# Patient Record
Sex: Male | Born: 1975 | Race: Black or African American | Hispanic: No | Marital: Single | Smoking: Light tobacco smoker
Health system: Southern US, Community
[De-identification: ages and names within clinical notes are randomized; demographics above are authoritative.]

## PROBLEM LIST (undated history)

## (undated) DIAGNOSIS — I10 Essential (primary) hypertension: Secondary | ICD-10-CM

## (undated) HISTORY — DX: Essential (primary) hypertension: I10

---

## 2008-05-29 ENCOUNTER — Ambulatory Visit: Payer: Self-pay | Admitting: Thoracic Surgery

## 2008-05-29 ENCOUNTER — Inpatient Hospital Stay (HOSPITAL_COMMUNITY): Admission: EM | Admit: 2008-05-29 | Discharge: 2008-06-05 | Payer: Self-pay | Admitting: Emergency Medicine

## 2008-05-29 ENCOUNTER — Ambulatory Visit: Payer: Self-pay | Admitting: Pulmonary Disease

## 2008-06-13 ENCOUNTER — Encounter: Admission: RE | Admit: 2008-06-13 | Discharge: 2008-06-13 | Payer: Self-pay | Admitting: Thoracic Surgery

## 2008-06-13 ENCOUNTER — Ambulatory Visit: Payer: Self-pay | Admitting: Thoracic Surgery

## 2008-07-05 ENCOUNTER — Encounter: Admission: RE | Admit: 2008-07-05 | Discharge: 2008-07-05 | Payer: Self-pay | Admitting: Thoracic Surgery

## 2008-07-05 ENCOUNTER — Ambulatory Visit: Payer: Self-pay | Admitting: Thoracic Surgery

## 2008-11-08 ENCOUNTER — Emergency Department (HOSPITAL_COMMUNITY): Admission: EM | Admit: 2008-11-08 | Discharge: 2008-11-08 | Payer: Self-pay | Admitting: Emergency Medicine

## 2010-01-20 IMAGING — CR DG CHEST 1V PORT
1 series · 1 of 1 positions shown · non-contrast
Comparison: 06/03/2008

CLINICAL DATA: Spontaneous right pneumothorax

PORTABLE CHEST - 1 VIEW

[view not recorded]
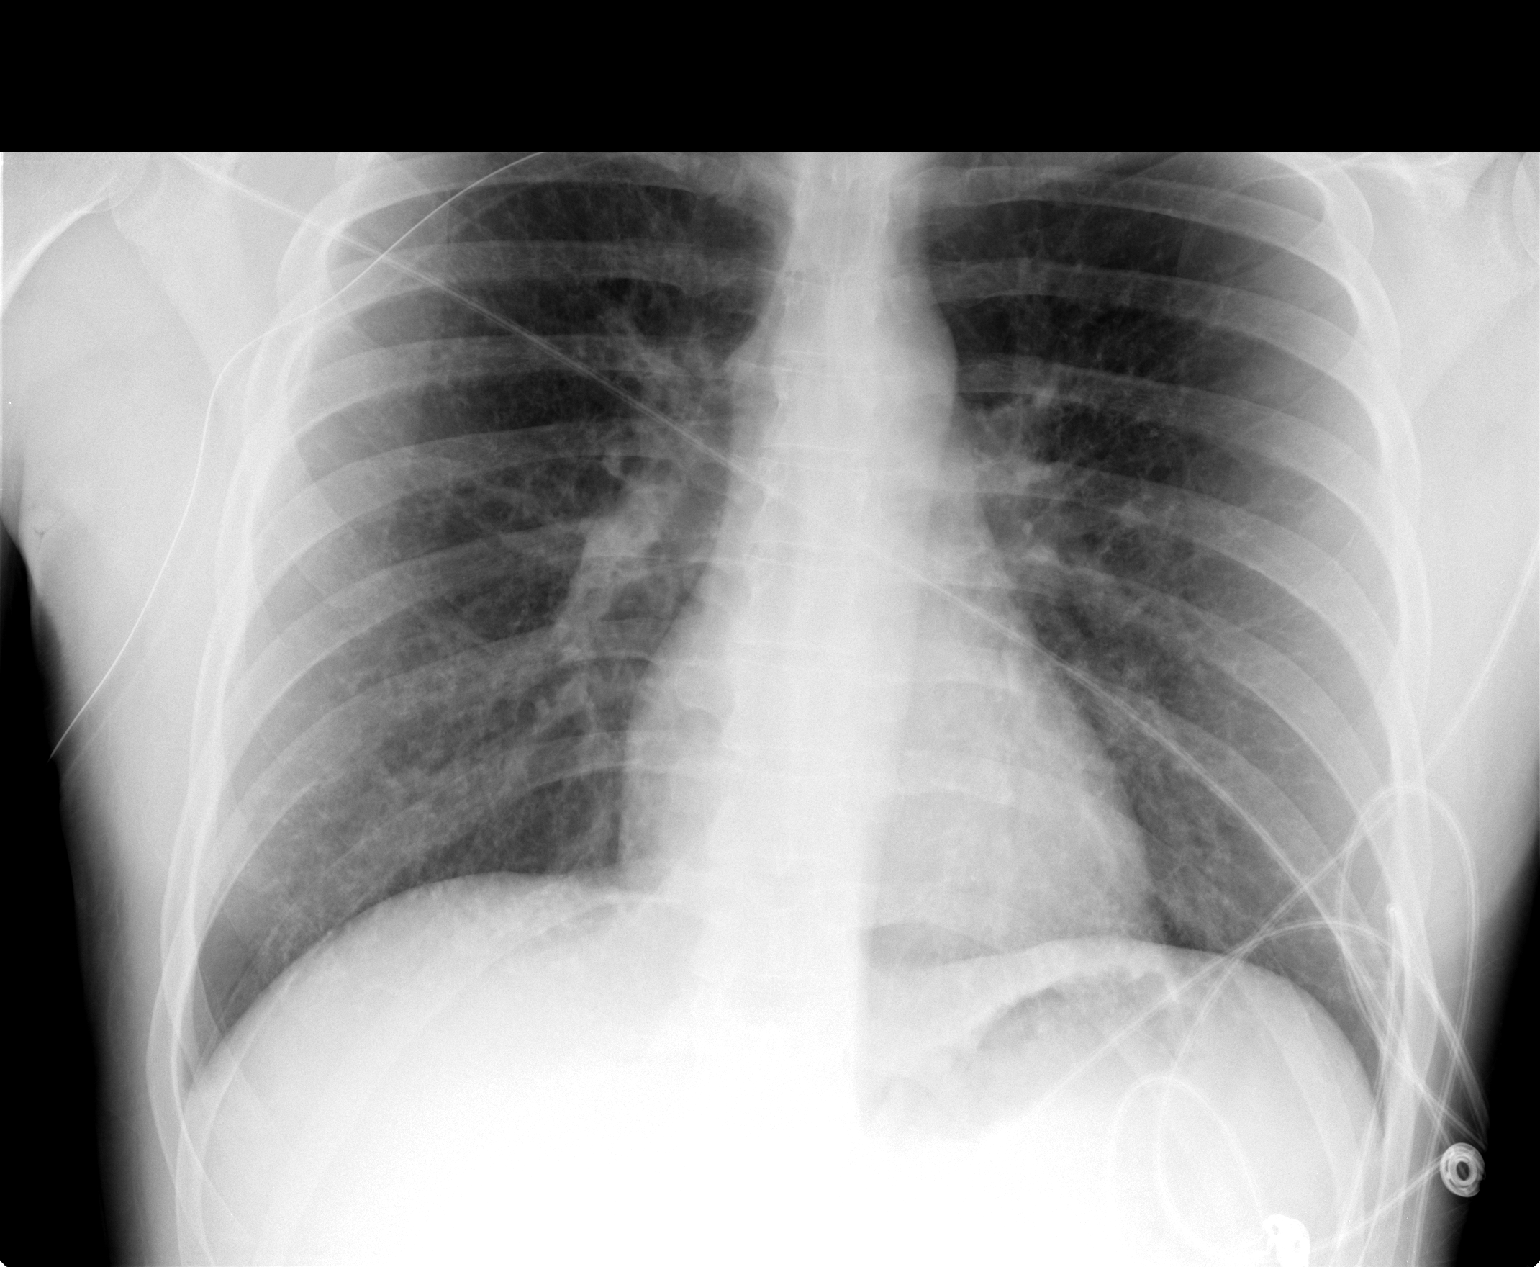

[1 of 1 positions shown; findings below may reference images not displayed]

FINDINGS: Right chest tube remains in place.  A tiny less than 5%
right apical pneumothorax is seen which it is more readily
visualized on prior exam.  The lungs are otherwise clear.  Heart
size is normal.
IMPRESSION: Tiny less 5% right pneumothorax.  Right chest tube remains in
place.

## 2010-01-29 IMAGING — CR DG CHEST 2V
2 series · 2 of 2 positions shown · non-contrast
Comparison: 06/05/2008

CLINICAL DATA: Status post a spontaneous pneumothorax.

CHEST - 2 VIEW

[w chest pa]
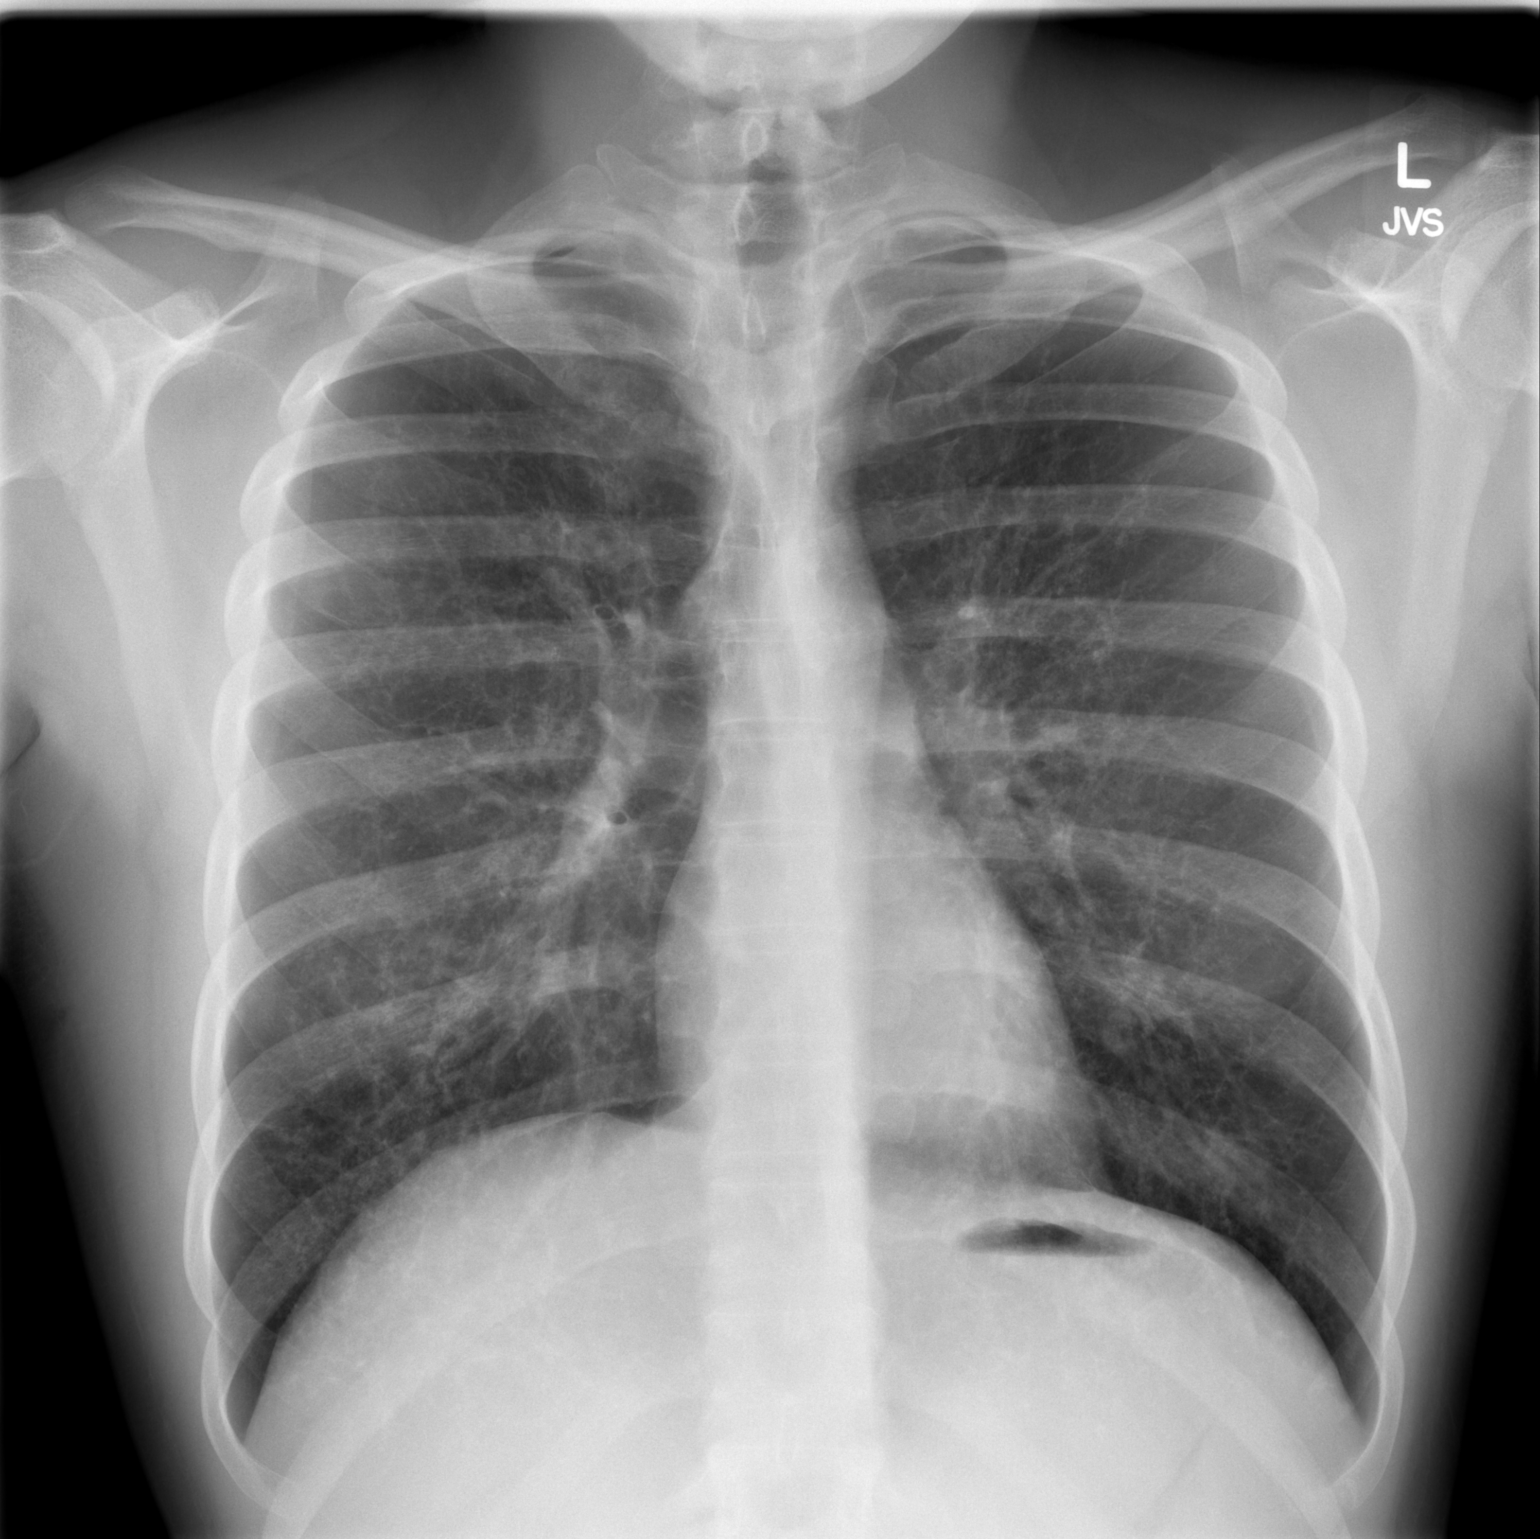

[w chest lat]
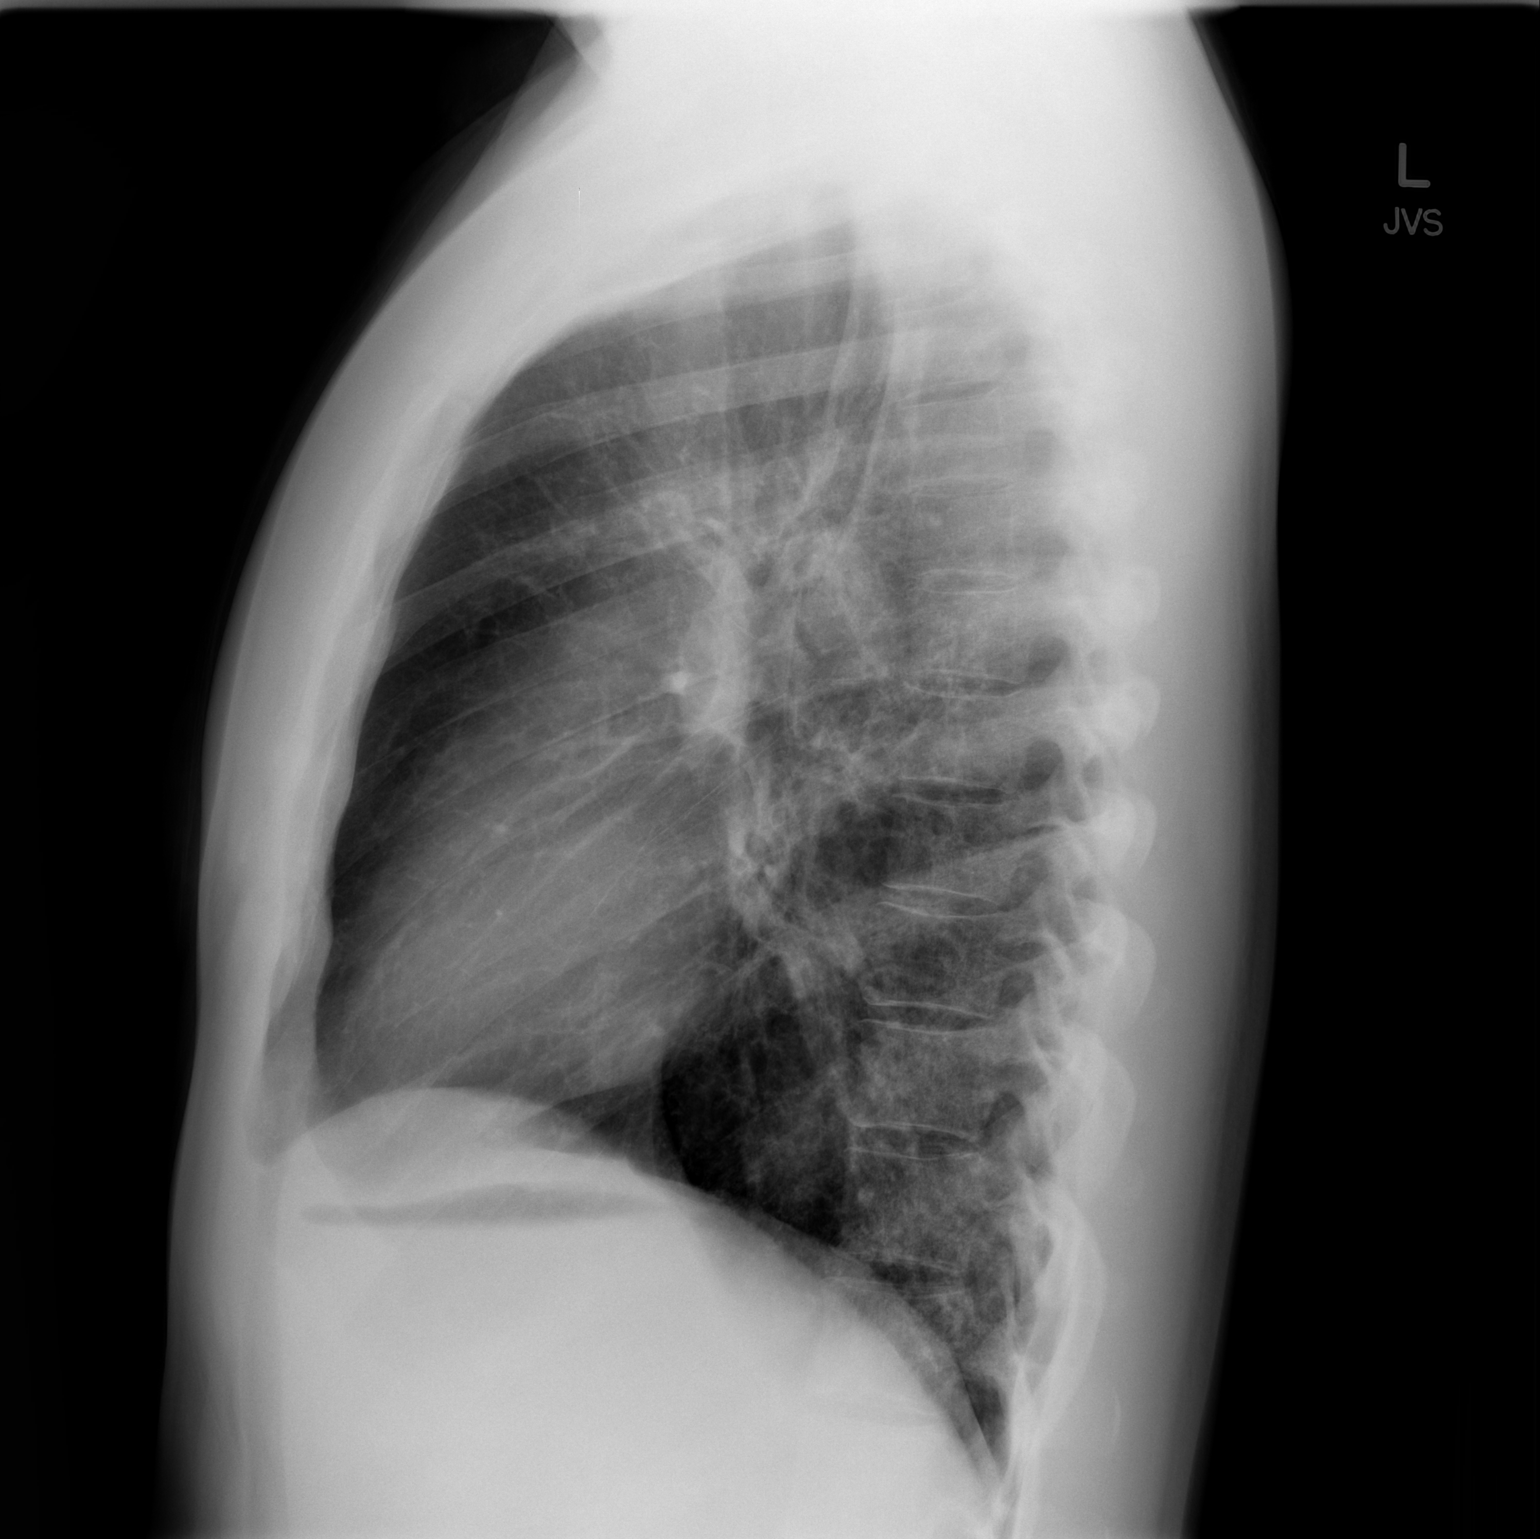

[2 of 2 positions shown; findings below may reference images not displayed]

FINDINGS: The pneumothorax has resolved.  Interstitial lung disease
is stable.  The heart is normal in size.  No effusion.
IMPRESSION: Resolved right pneumothorax.  Otherwise stable.

## 2010-12-28 ENCOUNTER — Encounter: Payer: Self-pay | Admitting: Family Medicine

## 2010-12-29 ENCOUNTER — Encounter: Payer: Self-pay | Admitting: Thoracic Surgery

## 2011-04-21 NOTE — Discharge Summary (Signed)
[name], [name]                 ACCOUNT NO.:  [id number]   MEDICAL RECORD NO.:  [id number]          PATIENT TYPE:  INP   LOCATION:  2009                         FACILITY:  [facility]   PHYSICIAN:  Ines Bloomer, M.D. DATE OF BIRTH:  01-18-1976   DATE OF ADMISSION:  05/29/2008  DATE OF DISCHARGE:  06/05/2008                               DISCHARGE SUMMARY   ADMITTING DIAGNOSIS:  Right spontaneous tension pneumothorax.   DISCHARGE DIAGNOSIS:  Right spontaneous tension pneumothorax.   PROCEDURE:  Placement of a #20 trocar chest tube by Dr. Edwyna Shell on May 29, 2008.   HISTORY PRESENTING ILLNESS:  This is a 35 year old African American male  previously healthy who after taking his shower the morning of admission  experienced some right upper chest discomfort.  This seemed to worsen  over the next hour and the discomfort moved down the anterior right side  of the chest toward the diaphragm, and then to his right lower back.  He  originally presented to an urgent care center where chest x-ray was  done.  He was found to have a right pneumothorax.  He then presented to  [facility] Emergency Room where a chest x-ray again revealed a large  right pneumothorax with slight mediastinal shift to the left.  As  previously stated, Dr. Edwyna Shell placed a right chest tube.  This was  placed on suction.  It should be noted that the patient's creatinine was  1.3 upon admission.  He was hydrated.  His creatinine then remained  stable thereafter.  He was not found to have an air leak in his chest  tube.  He was taken off suction on May 31, 2008; however, he was noted  to have an increasing size of the right pneumothorax.  He was placed  back on suction.  He was found to have a small air leak.  A CT scan of  the chest was then done, which showed a tiny residual right  pneumothorax.  A single tiny bleb of focus of emphysema in the posterior  right upper lobe and cluster diffuse likely peribronchial  vascular micro  nodules likely an infectious process.  A pulmonary consult was obtained  and autoimmune workup was initiated.  It should be noted that the  patient was found to be negative for antinuclear antibodies as well as  Legionella.  His ACE was 45.  His serum myoglobin was 29.  His CRP was  0.5.  ESR was 10.  Serial chest x-rays were continued.  Again, he was  found to have a small stable right apical pneumothorax.  He was placed  on water seal on June 04, 2008.  Chest tube was removed on this date.  Followup chest x-ray revealed again tiny right apical pneumothorax (less  than of 5%).  Currently, the patient is without complaints.  He denies  any shortness of breath.   PHYSICAL EXAMINATION:  GENERAL:  He is afebrile.  VITAL SIGNS:  Stable. O2 sat is 98%-100% on room air.  His chest x-ray again today shows a trace apical right pneumothorax, not  significantly changed from prior.  CARDIOVASCULAR:  Regular rate and rhythm.  PULMONARY:  Clear.  No rales, wheezes, or rhonchi.  EXTREMITIES:  No edema.   Per Dr. Edwyna Shell, the patient is going to be discharged home today.   DISCHARGE INSTRUCTIONS:  Include the following, he is to remain on a  regular diet.  He may remove his right chest dressing on June 06, 2008.  He may use soap and water to the wound and let open to air.  He is to  increase his activities slowly.  He may shower.  He is not to lift or  drive for 2-3 weeks.  Followup appointment include Dr. Edwyna Shell in 1 week.  He needs to call the office for an appointment.  Prior to this  appointment, chest x-ray will be obtained.  He also needs to contact Dr.  Percival Spanish office to arrange for followup appointment.  He will need to  have a CAT scan of the chest done without contrast (in 3 months).  This  will also be arranged as an outpatient.   DISCHARGE MEDICATIONS:  The patient is not having any pain and does not  wish to have a prescription for narcotics at this time.       Doree Fudge, [place]      Ines Bloomer, M.D.  Electronically Signed    DZ/MEDQ  D:  06/05/2008  T:  06/06/2008  Job:  [id number]   cc:   Felipa Evener, MD

## 2011-04-21 NOTE — Assessment & Plan Note (Signed)
OFFICE VISIT   [name], [name]  DOB:  Oct 10, 1976                                        June 13, 2008  CHART #:  [id number]   The patient came to the office today, and we have reviewed his chest x-  ray that shows that his pneumothorax is resolved, he is breathing well.  I removed his chest tube sutures.  He is doing well overall.  I have  told him to gradually increase his activities, and he is to follow up  with the pulmonologist.   His blood pressure is 134/89, pulse 94, respirations 18, and sats were  99%.   I will see him again in 3 weeks with a chest x-ray for final check.   Ines Bloomer, M.D.  Electronically Signed   DPB/MEDQ  D:  06/13/2008  T:  06/14/2008  Job:  [id number]

## 2011-04-21 NOTE — Consult Note (Signed)
NAMEJAEQUAN, [name]                 ACCOUNT NO.:  [id number]   MEDICAL RECORD NO.:  [id number]          PATIENT TYPE:  INP   LOCATION:  2009                         FACILITY:  [facility]   PHYSICIAN:  Felipa Evener, MD  DATE OF BIRTH:  May 14, 1976   DATE OF CONSULTATION:  DATE OF DISCHARGE:                                 CONSULTATION   HISTORY:  The patient is a 35 year old male with no significant past  medical history who is in the normal state of health until approximately  5 days ago when he started noticing right-sided shoulder pain that was  radiating through to his back.  The patient came to an Urgent Care  Center with that compliant and chest x-ray was performed and the patient  was found to have spontaneous pneumothorax on the right side that was  approximately 70% of the right hemithorax with no evidence of tension.  The patient has been admitted to the hospital.  Chest tube was placed by  cardiothoracic surgery and pneumothorax slowly resolved; however, the  chest tube was performed and the patient was found to have multiple  small pulmonary nodules that are present mostly in the dependant areas  of the lungs.  Upon interviewing the patient, he is a lifelong  nonsmoker, nondrinker, or minimal drinking.  No history of any drug use.  No significant history of occupational exposure.  Works as a Metallurgist.  Denies any exposure to dust or moulds or any environmental  factor.  The only thing he reports is that he [place]  that has significant amount of bird dropping.  He also was in [place] in  2003 as a part of the stay [place] in the [employer], but had had  no respiratory diseases from that.  He has no respiratory symptoms at  all.  He was able do vigorous activity with minimal effects.  The  patient denies any cough, fever, chills, nausea, vomiting, abdominal  pain or chest pain.   PAST MEDICAL HISTORY:  Significant for fracture of the left radius as  well as to right finger approximately 14 years ago during the football  accident, however, otherwise negative.   PAST SURGICAL HISTORY:  Negative.   ALLERGIES:  No known drug allergies.   MEDICATIONS:  None.   SOCIAL HISTORY:  He occasionally smokes cigars.  He had eight cigars in  the last year and a half.  Social alcohol intake.  No remote drug abuse  as well.   FAMILY HISTORY:  Both parents are still alive.  Both have hypertension.  Mother is 80 and father is 11.   A 12-point review of systems was performed and was negative other than  mentioned above.   PHYSICAL EXAMINATION:  GENERAL:  This is a well-appearing 35 year old  African American male with no significant past medical history resting  comfortably on the exam bed in no acute distress.  VITAL SIGNS:  He has temperature of 98.6, heart rate 93, respiratory 18,  blood pressure 143/99, saturation was 99% on room air.  HEENT  EXAM:  Normocephalic and atraumatic.  Pupils equal, round, and  reactive to light.  Extraocular movements are intact. Oral and nasal  mucosa within normal limits.  NECK:  No thyromegaly, lymphadenopathy.  Normal jugular venous reflux  appreciated.  HEART:  Regular rate and rhythm.  S1 and S2.  No murmurs, rubs, or  gallops appreciated.  LUNGS:  Clear to auscultation bilaterally.  ABDOMEN:  Soft, nontender, and nondistended.  Positive bowel sounds.  EXTREMITIES:  No edema.  No tenderness appreciated.  NEUROLOGIC EXAM:  Grossly intact.  SKIN EXAM:  Shows no significant rashes.   LABORATORY STUDIES:  B-MET done today showed sodium of 138, potassium of  4.5, chloride of 98, CO2 of 32.  BUN of 10 and creatinine 1.24.   On the day of presentation, the bicarb was 27 and suddenly continued to  increase, today is 32.   Chest x-rays showed the chest tube in proper placement with no visible  pneumothorax.  The chest CT done on June 01, 2008 showed right side  chest tube in proper position, minimally  appreciated pneumothoraces.  The dependent areas of the lung showed some small scattered pulmonary  nodules suspicious for tree-in-bud appearance.   ASSESSMENT AND PLAN:  The patient is a 35 year old male with no  significant past medical history who presents for a spontaneous  pneumothorax.  PCCM is called for evaluation of scattered pulmonary  nodules that are present now while the presentation of the pneumothorax  and multiple pulmonary nodules suspicious for histiocytosis X.  The  patient is a lifelong nonsmoker, which would make this less likely.  The  presence of the nodules, however, makes for a further workup.  Differential diagnosis while includes histiocytosis X  would be less  likely given the fact that the patient is a lifelong nonsmoker.  Other  things to consider would be an typical pulmonary infection since the  patient reported that he is recovering from cold, this could be  residual bronchiolitis from that as well.  Given the fact, the patient  is African American , other things to consider would be sarcoidosis as  well as Wegener granulomatosis.  Therefore, at this point, we will check  serum, ESR, CRPA, rheumatoid factor, ANCA and ACE level as well as serum  Histoplasma antigen and Mycoplasma IgM.  We will check his urinalysis  for evidence of any blood within it as well as urine Legionella antigen  for a potential atypical infection and given the bicarb level of 33, we  will check an ABG and we will potentially do further evaluation for  metabolic alkalosis if that is presently ABG, which is high likely given  the fact that the bicarb is 32.  Plan, we will repeat chest x-ray in the  morning. The patient will most likely require a repeat chest CT in 3  months to evaluate for the disappearance of the small nodules especially  given the fact that he has had a cold recently.  We would not recommend  an open lung biopsy at this time given the patient's age.  If the   patient develops the pneumothorax for the second time and would require  a surgical pleurodesis rather than talc given his age and at that point,  we would get a lung biopsy for further evaluation.   Thank you for allowing [place] to participate in the patient's care.  We will  continue to follow with you.      Felipa Evener, MD  Electronically  Signed     WJY/MEDQ  D:  06/03/2008  T:  06/04/2008  Job:  [id number]

## 2011-04-21 NOTE — Op Note (Signed)
NAMETERRELLE, RUFFOLO                 ACCOUNT NO.:  [id number]   MEDICAL RECORD NO.:  [id number]          PATIENT TYPE:  INP   LOCATION:  1832                         FACILITY:  [facility]   PHYSICIAN:  Ines Bloomer, M.D. DATE OF BIRTH:  1976/03/19   DATE OF PROCEDURE:  DATE OF DISCHARGE:                               OPERATIVE REPORT   PREOPERATIVE DIAGNOSIS:  An 80% right pneumothorax with tension.   POSTOPERATIVE DIAGNOSIS:  An 80% right pneumothorax with tension.   OPERATION PERFORMED:  Insertion of right chest tube.   DESCRIPTION OF PROCEDURE:  After prep and draping the right chest and  giving the patient 4 mg of Versed, the area was infiltrated at the fifth  intercostal space at the midaxillary line and this was infiltrated with  Xylocaine.  After this has been done, an incision was made and 2 sutures  were placed in the 1-inch incision and dissection was carried down with  the hemostat to the intercostal space, and this was blocked first with  Xylocaine and then the intercostal space was entered and then #20 trocar  chest tube was inserted.  The trocar was removed.  The chest tube was  inserted in place and sutured and tied in place with a 0 silk and  connected to Pleur-Evac.  A dry and sterile dressing applied.  The  patient returned to recovery room in stable condition.      Ines Bloomer, M.D.  Electronically Signed     DPB/MEDQ  D:  05/29/2008  T:  05/30/2008  Job:  [id number]

## 2011-04-21 NOTE — H&P (Signed)
[name], [name]                 ACCOUNT NO.:  [id number]   MEDICAL RECORD NO.:  [id number]          PATIENT TYPE:  EMS   LOCATION:  MAJO                         FACILITY:  [facility]   PHYSICIAN:  Ines Bloomer, M.D. DATE OF BIRTH:  10-09-1976   DATE OF ADMISSION:  05/29/2008  DATE OF DISCHARGE:                              HISTORY & PHYSICAL   REASON FOR ADMISSION:  Right spontaneous tension pneumothorax.   HISTORY OF PRESENTING ILLNESS:  This is a 35 year old previously healthy  African American male who after taking his shower this morning  experienced some right upper chest discomfort.  He noticed that after  exhalation or after talking for a while, this discomfort worsened over  the next hour.  His right upper chest discomfort also moved down  anterior right side of the chest towards the diaphragm, the pain then  moved to the right lower back.  He presented to the urgent care center  where chest x-ray was done.  He was found to have a right pneumothorax.  The patient then presented to [facility] where again a  chest x-ray revealed a large right pneumothorax with slight mediastinal  shift to the left.  Dr. Edwyna Shell was consulted regarding the necessitation  for chest tube placement and further management.   PAST MEDICAL HISTORY:  Significant for fracture of the left radius from  a football injury, approximately 14 years ago.  Otherwise, negative.   PAST SURGICAL HISTORY:  Denies.   ALLERGIES:  No known drug allergies.   MEDICATIONS:  On a daily basis, denies.   SOCIAL HISTORY:  Various cigar use.  Alcohol intake, he describes as  social.   FAMILY HISTORY:  Mother and father are still alive.  Both have  hypertension.  Mother is 70.  Father is 56.   REVIEW OF SYMPTOMS:  The patient had some shortness of breath mostly  chest discomfort as described in the HPI.  Denies any fever, chills,  night sweats, cough, sputum, or hemoptysis.  Denies any abdominal pain,  nauseaousness, or vomiting.  No melena or hematochezia.  Denies any  blurred or double vision.  Denies dysuria or hematuria.  Remaining  review of symptoms are noncontributory.   PHYSICAL EXAMINATION:  GENERAL:  This is a pleasant 35 year old [place]  American male with no acute distress.  He is alert, oriented, and  cooperative.  VITAL SIGNS:  Reveal he is afebrile.  Heart rate is in the low100s, BP  136/82, respiratory rate 20-24, and O2 sat of 100% on 2 L of nasal  cannula.  HEENT:  Head is atraumatic and normocephalic.  Eyes:  Extraocular  movements are intact.  Pupils are equal, round, and reactive to light  and accommodation.  Sclerae anicteric.  NECK:  Supple.  No JVD.  No lymphadenopathy.  CARDIOVASCULAR:  Slightly tachycardic.  PULMONARY EXAM:  Left lung clear to auscultation.  No rales, wheezes, or  rhonchi.  Right lung, diminished breath sounds.  ABDOMEN:  Soft and nontender.  Bowel sounds present.  No CVA tenderness.  No rebound or guarding.  EXTREMITIES:  No cyanosis, clubbing, or edema.  NEUROLOGIC:  Cranial nerve II through XII grossly intact.  No focal  deficits.   PERTINENT IMAGING STUDY:  Chest x-ray has already been described  previously.   LABORATORY DATA:  Laboratory studies revealed his H&H to be 17.3 and 51  respectively.  Potassium of 3.5 and creatinine level of 1.3  respectively.   IMPRESSION AND PLAN:  Right spontaneous tension pneumothorax.  The  patient is going to have a chest tube placed by Dr. Edwyna Shell.  Chest tube  then will be placed to 20 cm of suction.  Stat chest x-ray will be  obtained now.  The patient will have daily chest x-rays obtained  thereafter.  We will continue monitor him closely.      Doree Fudge, [place]      Ines Bloomer, M.D.  Electronically Signed    DZ/MEDQ  D:  05/29/2008  T:  05/30/2008  Job:  [id number]

## 2011-04-21 NOTE — Assessment & Plan Note (Signed)
OFFICE VISIT   [name], [name]  DOB:  07/22/1976                                        July 05, 2008  CHART #:  [id number]   The patient came to the office with chest x-ray stable and with no  recurrence of his pneumothorax.  His blood pressure is 143/91, pulse  100, respirations 18, and sats were 99%.  Lungs were clear to  auscultation and percussion.  The chest tube site is well-healed.  I  will see him back again if he has any future problems.   Ines Bloomer, M.D.  Electronically Signed   DPB/MEDQ  D:  07/05/2008  T:  07/06/2008  Job:  [id number]

## 2011-09-03 LAB — BASIC METABOLIC PANEL
BUN: 7
BUN: 9
Chloride: 101
Chloride: 98
Creatinine, Ser: 1.25
GFR calc Af Amer: >60
GFR calc Af Amer: >60
GFR calc Af Amer: >60
GFR calc non Af Amer: >60
GFR calc non Af Amer: >60
Potassium: 4.2
Potassium: 4.2
Potassium: 4.5
Potassium: 4.6
Sodium: 140

## 2011-09-03 LAB — RENAL FUNCTION PANEL
Albumin: 4.3
CO2: 31
Chloride: 99
Creatinine, Ser: 1.25
GFR calc Af Amer: >60
GFR calc non Af Amer: >60
Potassium: 3.7

## 2011-09-03 LAB — COMPREHENSIVE METABOLIC PANEL
BUN: 9
CO2: 33 — ABNORMAL HIGH
Chloride: 102
Creatinine, Ser: 1.38
GFR calc non Af Amer: >60
Glucose, Bld: 96
Total Bilirubin: 0.7

## 2011-09-03 LAB — POCT I-STAT, CHEM 8
Chloride: 105
Glucose, Bld: 95
HCT: 51
Potassium: 3.5

## 2011-09-03 LAB — URINE CULTURE
Colony Count: 2000
Special Requests: NEGATIVE

## 2011-09-03 LAB — DIFFERENTIAL
Lymphocytes Relative: 18
Lymphs Abs: 1.3
Monocytes Relative: 6
Neutrophils Relative %: 73

## 2011-09-03 LAB — CBC
HCT: 44.9
HCT: 47.8
MCV: 82.9
Platelets: 131 — ABNORMAL LOW
Platelets: 144 — ABNORMAL LOW
RBC: 5.42
RBC: 5.77
WBC: 7.2
WBC: 8.3

## 2011-09-03 LAB — LEGIONELLA ANTIGEN, URINE

## 2011-09-03 LAB — BLOOD GAS, ARTERIAL
Bicarbonate: 26.1 — ABNORMAL HIGH
FIO2: 0.21
O2 Saturation: 98
pH, Arterial: 7.425
pO2, Arterial: 97.8

## 2011-09-03 LAB — URINALYSIS, ROUTINE W REFLEX MICROSCOPIC
Nitrite: NEGATIVE
Specific Gravity, Urine: 1.011
Urobilinogen, UA: 0.2
pH: 7.5

## 2011-09-03 LAB — ANTI-NEUTROPHIL ANTIBODY

## 2011-09-03 LAB — MYOGLOBIN, SERUM: Myoglobin: 29

## 2014-12-16 ENCOUNTER — Ambulatory Visit (INDEPENDENT_AMBULATORY_CARE_PROVIDER_SITE_OTHER): Payer: 59 | Admitting: Emergency Medicine

## 2014-12-16 ENCOUNTER — Ambulatory Visit (INDEPENDENT_AMBULATORY_CARE_PROVIDER_SITE_OTHER): Payer: 59

## 2014-12-16 VITALS — BP 202/145 | HR 96 | Temp 98.7°F | Resp 16 | Ht 71.0 in | Wt 235.6 lb

## 2014-12-16 DIAGNOSIS — Z8709 Personal history of other diseases of the respiratory system: Secondary | ICD-10-CM

## 2014-12-16 DIAGNOSIS — R030 Elevated blood-pressure reading, without diagnosis of hypertension: Secondary | ICD-10-CM

## 2014-12-16 DIAGNOSIS — I10 Essential (primary) hypertension: Secondary | ICD-10-CM

## 2014-12-16 DIAGNOSIS — R000 Tachycardia, unspecified: Secondary | ICD-10-CM

## 2014-12-16 DIAGNOSIS — IMO0001 Reserved for inherently not codable concepts without codable children: Secondary | ICD-10-CM

## 2014-12-16 DIAGNOSIS — R809 Proteinuria, unspecified: Secondary | ICD-10-CM

## 2014-12-16 LAB — POCT CBC
Granulocyte percent: 74.4 %G (ref 37–80)
HCT, POC: 52 % (ref 43.5–53.7)
HEMOGLOBIN: 17.1 g/dL (ref 14.1–18.1)
Lymph, poc: 1.6 (ref 0.6–3.4)
MCH, POC: 27.1 pg (ref 27–31.2)
MCHC: 32.8 g/dL (ref 31.8–35.4)
MCV: 82.5 fL (ref 80–97)
MID (cbc): 0.6 (ref 0–0.9)
MPV: 7.5 fL (ref 0–99.8)
POC GRANULOCYTE: 6.3 (ref 2–6.9)
POC LYMPH PERCENT: 19 %L (ref 10–50)
POC MID %: 6.6 %M (ref 0–12)
Platelet Count, POC: 151 10*3/uL (ref 142–424)
RBC: 6.3 M/uL — AB (ref 4.69–6.13)
RDW, POC: 12.8 %
WBC: 8.5 10*3/uL (ref 4.6–10.2)

## 2014-12-16 LAB — POCT URINALYSIS DIPSTICK
Bilirubin, UA: NEGATIVE
GLUCOSE UA: NEGATIVE
Leukocytes, UA: NEGATIVE
Nitrite, UA: NEGATIVE
PH UA: 6.5
Spec Grav, UA: 1.02
UROBILINOGEN UA: 0.2

## 2014-12-16 LAB — BASIC METABOLIC PANEL
BUN: 17 mg/dL (ref 4–21)
CREATININE: 1.6 mg/dL — AB (ref 0.6–1.3)
Glucose: 102 mg/dL
POTASSIUM: 4.4 mmol/L (ref 3.4–5.3)
Sodium: 139 mmol/L (ref 137–147)

## 2014-12-16 LAB — LIPID PANEL
CHOLESTEROL: 148 mg/dL (ref 0–200)
HDL: 46 mg/dL (ref 35–70)
LDL Cholesterol: 83 mg/dL
LDL/HDL RATIO: 3.2
Triglycerides: 95 mg/dL (ref 40–160)

## 2014-12-16 LAB — HEPATIC FUNCTION PANEL
ALT: 59 U/L — AB (ref 10–40)
AST: 25 U/L (ref 14–40)
Alkaline Phosphatase: 84 U/L (ref 25–125)
Bilirubin, Total: 0.7 mg/dL

## 2014-12-16 MED ORDER — LISINOPRIL-HYDROCHLOROTHIAZIDE 20-12.5 MG PO TABS: 1.0000 | ORAL_TABLET | Freq: Every day | ORAL | Status: DC

## 2014-12-16 NOTE — Patient Instructions (Addendum)
- [facility] so that we can collect a 24 hour urine sample to further evaluate protein in your urine. - Please start 2 blood pressure medications: Lisinopril 20mg  daily, Hydrochlorothiazide 12.5mg  daily - When the rest of your blood results come back, I will call you and make changes as needed to our treatment plan. - Be on the lookout for your referral to a kidney specialist. - Please come to our Appointment Center at [address] at [id number] on [id number]/2015 for follow up.   Managing Your High Blood Pressure Blood pressure is a measurement of how forceful your blood is pressing against the walls of the arteries. Arteries are muscular tubes within the circulatory system. Blood pressure does not stay the same. Blood pressure rises when you are active, excited, or nervous; and it lowers during sleep and relaxation. If the numbers measuring your blood pressure stay above normal most of the time, you are at risk for health problems. High blood pressure (hypertension) is a long-term (chronic) condition in which blood pressure is elevated. A blood pressure reading is recorded as two numbers, such as 120 over 80 (or 120/80). The first, higher number is called the systolic pressure. It is a measure of the pressure in your arteries as the heart beats. The second, lower number is called the diastolic pressure. It is a measure of the pressure in your arteries as the heart relaxes between beats.  Keeping your blood pressure in a normal range is important to your overall health and prevention of health problems, such as heart disease and stroke. When your blood pressure is uncontrolled, your heart has to work harder than normal. High blood pressure is a very common condition in adults because blood pressure tends to rise with age. Men and women are equally likely to have hypertension but at different times in life. Before age 37, men are more likely to have hypertension. After 37 years of age, women are  more likely to have it. Hypertension is especially common in African Americans. This condition often has no signs or symptoms. The cause of the condition is usually not known. Your caregiver can help you come up with a plan to keep your blood pressure in a normal, healthy range. BLOOD PRESSURE STAGES Blood pressure is classified into four stages: normal, prehypertension, stage 1, and stage 2. Your blood pressure reading will be used to determine what type of treatment, if any, is necessary. Appropriate treatment options are tied to these four stages:  Normal  Systolic pressure (mm Hg): below 120.  Diastolic pressure (mm Hg): below 80. Prehypertension  Systolic pressure (mm Hg): 120 to 139.  Diastolic pressure (mm Hg): 80 to 89. Stage1  Systolic pressure (mm Hg): 140 to 159.  Diastolic pressure (mm Hg): 90 to 99. Stage2  Systolic pressure (mm Hg): 160 or above.  Diastolic pressure (mm Hg): 100 or above. RISKS RELATED TO HIGH BLOOD PRESSURE Managing your blood pressure is an important responsibility. Uncontrolled high blood pressure can lead to:  A heart attack.  A stroke.  A weakened blood vessel (aneurysm).  Heart failure.  Kidney damage.  Eye damage.  Metabolic syndrome.  Memory and concentration problems. HOW TO MANAGE YOUR BLOOD PRESSURE Blood pressure can be managed effectively with lifestyle changes and medicines (if needed). Your caregiver will help you come up with a plan to bring your blood pressure within a normal range. Your plan should include the following: Education  Read all information provided by your caregivers about  how to control blood pressure.  Educate yourself on the latest guidelines and treatment recommendations. New research is always being done to further define the risks and treatments for high blood pressure. Lifestylechanges  Control your weight.  Avoid smoking.  Stay physically active.  Reduce the amount of salt in your  diet.  Reduce stress.  Control any chronic conditions, such as high cholesterol or diabetes.  Reduce your alcohol intake. Medicines  Several medicines (antihypertensive medicines) are available, if needed, to bring blood pressure within a normal range. Communication  Review all the medicines you take with your caregiver because there may be side effects or interactions.  Talk with your caregiver about your diet, exercise habits, and other lifestyle factors that may be contributing to high blood pressure.  See your caregiver regularly. Your caregiver can help you create and adjust your plan for managing high blood pressure. RECOMMENDATIONS FOR TREATMENT AND FOLLOW-UP  The following recommendations are based on current guidelines for managing high blood pressure in nonpregnant adults. Use these recommendations to identify the proper follow-up period or treatment option based on your blood pressure reading. You can discuss these options with your caregiver.  Systolic pressure of 120 to 139 or diastolic pressure of 80 to 89: Follow up with your caregiver as directed.  Systolic pressure of 140 to 160 or diastolic pressure of 90 to 100: Follow up with your caregiver within 2 months.  Systolic pressure above 160 or diastolic pressure above 100: Follow up with your caregiver within 1 month.  Systolic pressure above 180 or diastolic pressure above 110: Consider antihypertensive therapy; follow up with your caregiver within 1 week.  Systolic pressure above 200 or diastolic pressure above 120: Begin antihypertensive therapy; follow up with your caregiver within 1 week. Document Released: 08/17/2012 Document Reviewed: 08/17/2012 [facility] Patient Information 2015 [place], [place]. This information is not intended to replace advice given to you by your health care provider. Make sure you discuss any questions you have with your health care provider.    DASH Eating Plan DASH stands for "Dietary  Approaches to Stop Hypertension." The DASH eating plan is a healthy eating plan that has been shown to reduce high blood pressure (hypertension). Additional health benefits may include reducing the risk of type 2 diabetes mellitus, heart disease, and stroke. The DASH eating plan may also help with weight loss. WHAT DO I NEED TO KNOW ABOUT THE DASH EATING PLAN? For the DASH eating plan, you will follow these general guidelines:  Choose foods with a percent daily value for sodium of less than 5% (as listed on the food label).  Use salt-free seasonings or herbs instead of table salt or sea salt.  Check with your health care provider or pharmacist before using salt substitutes.  Eat lower-sodium products, often labeled as "lower sodium" or "no salt added."  Eat fresh foods.  Eat more vegetables, fruits, and low-fat dairy products.  Choose whole grains. Look for the word "whole" as the first word in the ingredient list.  Choose fish and skinless chicken or [place] more often than red meat. Limit fish, poultry, and meat to 6 oz (170 g) each day.  Limit sweets, desserts, sugars, and sugary drinks.  Choose heart-healthy fats.  Limit cheese to 1 oz (28 g) per day.  Eat more home-cooked food and less restaurant, buffet, and fast food.  Limit fried foods.  Cook foods using methods other than frying.  Limit canned vegetables. If you do use them, rinse them well to  decrease the sodium.  When eating at a restaurant, ask that your food be prepared with less salt, or no salt if possible. WHAT FOODS CAN I EAT? Seek help from a dietitian for individual calorie needs. Grains Whole grain or whole wheat bread. Brown rice. Whole grain or whole wheat pasta. Quinoa, bulgur, and whole grain cereals. Low-sodium cereals. Corn or whole wheat flour tortillas. Whole grain cornbread. Whole grain crackers. Low-sodium crackers. Vegetables Fresh or frozen vegetables (raw, steamed, roasted, or grilled).  Low-sodium or reduced-sodium tomato and vegetable juices. Low-sodium or reduced-sodium tomato sauce and paste. Low-sodium or reduced-sodium canned vegetables.  Fruits All fresh, canned (in natural juice), or frozen fruits. Meat and Other Protein Products Ground beef (85% or leaner), grass-fed beef, or beef trimmed of fat. Skinless chicken or [place]. Ground chicken or [place]. Pork trimmed of fat. All fish and seafood. Eggs. Dried beans, peas, or lentils. Unsalted nuts and seeds. Unsalted canned beans. Dairy Low-fat dairy products, such as skim or 1% milk, 2% or reduced-fat cheeses, low-fat ricotta or cottage cheese, or plain low-fat yogurt. Low-sodium or reduced-sodium cheeses. Fats and Oils Tub margarines without trans fats. Light or reduced-fat mayonnaise and salad dressings (reduced sodium). Avocado. Safflower, olive, or canola oils. Natural peanut or almond butter. Other Unsalted popcorn and pretzels. The items listed above may not be a complete list of recommended foods or beverages. Contact your dietitian for more options. WHAT FOODS ARE NOT RECOMMENDED? Grains White bread. White pasta. White rice. Refined cornbread. Bagels and croissants. Crackers that contain trans fat. Vegetables Creamed or fried vegetables. Vegetables in a cheese sauce. Regular canned vegetables. Regular canned tomato sauce and paste. Regular tomato and vegetable juices. Fruits Dried fruits. Canned fruit in light or heavy syrup. Fruit juice. Meat and Other Protein Products Fatty cuts of meat. Ribs, chicken wings, bacon, sausage, bologna, salami, chitterlings, fatback, hot dogs, bratwurst, and packaged luncheon meats. Salted nuts and seeds. Canned beans with salt. Dairy Whole or 2% milk, cream, half-and-half, and cream cheese. Whole-fat or sweetened yogurt. Full-fat cheeses or blue cheese. Nondairy creamers and whipped toppings. Processed cheese, cheese spreads, or cheese curds. Condiments Onion and garlic salt,  seasoned salt, table salt, and sea salt. Canned and packaged gravies. Worcestershire sauce. Tartar sauce. Barbecue sauce. Teriyaki sauce. Soy sauce, including reduced sodium. Steak sauce. Fish sauce. Oyster sauce. Cocktail sauce. Horseradish. Ketchup and mustard. Meat flavorings and tenderizers. Bouillon cubes. Hot sauce. Tabasco sauce. Marinades. Taco seasonings. Relishes. Fats and Oils Butter, stick margarine, lard, shortening, ghee, and bacon fat. Coconut, palm kernel, or palm oils. Regular salad dressings. Other Pickles and olives. Salted popcorn and pretzels. The items listed above may not be a complete list of foods and beverages to avoid. Contact your dietitian for more information. WHERE CAN I FIND MORE INFORMATION? National Heart, Lung, and Blood Institute: [employer] Document Released: 11/12/2011 Document Revised: 04/09/2014 Document Reviewed: 09/27/2013 [facility] Patient Information 2015 [place], [place]. This information is not intended to replace advice given to you by your health care provider. Make sure you discuss any questions you have with your health care provider.

## 2014-12-16 NOTE — Progress Notes (Signed)
MRN: [id number] DOB: 1976/06/03  Subjective:   [name] is a 39 y.o. male with a pmh of spontaneous pneumothorax (2009) and family history of HTN, stroke presenting for 2 week history of feeling "off". Patient states it is difficult to verbalize how he feels, however he has had a previous similar episode in 2012 except with chest pain at that time. Per the patient he was worked up fully and thought to have reflux, tx with Zantac, symptoms resolved. Today, he states he tried Zantac x2 in the last week without any significant changes. The only symptoms patient identifies are occasional random numbness and tingling in right forearm and left lateral neck; also admits recent stress at work, patient is a Occupational hygienist at [employer], has not had adequate support. He denies chest pain, heart racing, palpitations, shob, diaphoresis, radiation of pain into extremities, back, neck or jaw; also denies headache, confusion, loss of vision, double vision, changes in speech, weakness, n/v, abdominal pain, urinary or defecation changes. Of note, patient eats fast food, processed meals regularly, does not exercise. Denies polyuria, polydipsia, polyphagia, family history of diabetes. Seldomly smokes cigars, no cigarettes, occasional alcohol. Denies any other aggravating or relieving factors, no other questions or concerns.  [name] currently has no medications in their medication list.  He has No Known Allergies.  PMH as above. Denies past surgical history.  ROS As in subjective.  Objective:   Vitals: BP 202/145 mmHg  Pulse 96  Temp(Src) 98.7 F (37.1 C) (Oral)  Resp 16  Ht  (1.803 m)  Wt 235 lb 9.6 oz (106.867 kg)  BMI 32.87 kg/m2  SpO2 99%  Blood Pressure recheck on exam: 208/108 left arm 204/112 right arm  Physical Exam  Constitutional: He is oriented to person, place, and time and well-developed, well-nourished, and in no distress.  Eyes: Conjunctivae and EOM are normal. Pupils are  equal, round, and reactive to light. Right eye exhibits no discharge. Left eye exhibits no discharge. No scleral icterus.  Neck: Normal range of motion. Neck supple. No thyromegaly present.  No carotid bruits.  Cardiovascular: Regular rhythm, normal heart sounds and intact distal pulses.  Exam reveals no gallop and no friction rub.   No murmur heard. Tachycardic on exam (HR 108)  Pulmonary/Chest: Effort normal and breath sounds normal. No stridor. No respiratory distress. He has no wheezes. He has no rales. He exhibits no tenderness.  Abdominal: Soft. Bowel sounds are normal. He exhibits no distension and no mass. There is no tenderness.  No bruits.  Musculoskeletal: Normal range of motion. He exhibits no edema or tenderness.  Lymphadenopathy:    He has no cervical adenopathy.  Neurological: He is alert and oriented to person, place, and time.  Skin: Skin is warm and dry. No rash noted. No erythema.  Psychiatric: Mood and affect normal.   Results for orders placed or performed in visit on 12/16/14 (from the past 24 hour(s))  POCT CBC     Status: Abnormal   Collection Time: 12/16/14  3:26 PM  Result Value Ref Range   WBC 8.5 4.6 - 10.2 K/uL   Lymph, poc 1.6 0.6 - 3.4   POC LYMPH PERCENT 19.0 10 - 50 %L   MID (cbc) 0.6 0 - 0.9   POC MID % 6.6 0 - 12 %M   POC Granulocyte 6.3 2 - 6.9   Granulocyte percent 74.4 37 - 80 %G   RBC 6.30 (A) 4.69 - 6.13 M/uL   Hemoglobin 17.1 14.1 -  18.1 g/dL   HCT, POC 16.152.0 09.643.5 - 53.7 %   MCV 82.5 80 - 97 fL   MCH, POC 27.1 27 - 31.2 pg   MCHC 32.8 31.8 - 35.4 g/dL   RDW, POC 04.512.8 %   Platelet Count, POC 151 142 - 424 K/uL   MPV 7.5 0 - 99.8 fL  POCT urinalysis dipstick     Status: None   Collection Time: 12/16/14  3:41 PM  Result Value Ref Range   Color, UA yellow    Clarity, UA clear    Glucose, UA neg    Bilirubin, UA neg    Ketones, UA trace    Spec Grav, UA 1.020    Blood, UA trace-lysed    pH, UA 6.5    Protein, UA >=300    Urobilinogen,  UA 0.2    Nitrite, UA neg    Leukocytes, UA Negative    ECG interpretation by Dr. Cleta Albertsaub and PA-Sasha Rogel: J-point elevations, borderline LVH, in V2 otherwise normal sinus rhythm.  [facility] reading (PRIMARY) by  Dr. Cleta Albertsaub and PA-Nasim Habeeb. CXR: Increased interstitial markings R>L, otherwise no acute cardiopulmonary process.  Dg Chest 2 View  12/16/2014   CLINICAL DATA:  Elevated blood pressure, tachycardia, chest pain  EXAM: CHEST  2 VIEW  COMPARISON:  None.  FINDINGS: Mild bilateral interstitial thickening. There is no focal parenchymal opacity, pleural effusion, or pneumothorax. The heart and mediastinal contours are unremarkable.  The osseous structures are unremarkable.  IMPRESSION: No lobar pneumonia. Bilateral interstitial thickening which may reflect chronic changes versus atypical infection.   Electronically Signed   By: Elige KoHetal  Patel   On: 12/16/2014 16:02   Assessment and Plan :   1. Elevated blood pressure 2. Tachycardia 3. History of pneumothorax 4. Proteinuria 5. Essential hypertension - Stable; physical exam, EKG, CXR reassuring - Significant proteinuria worrisome for nephrotic syndrome, advised patient complete a 24 hour urine sample, referral to Nephrology - Start lis-HCT combo, 20mg -12.5mg , advised that we will be adding a 3rd BP med, likely amlodipine, in 1 week - Counseled patient on need to seek immediate medical attention 911 or ED if we are closed should he develop symptoms as in patient instructions - Labs pending, will follow up by phone tomorrow to check on BP and discuss results - Follow up at 104 on 12/24/2014   Wallis BambergMario Ayushi Pla, PA-C [facility] [phone] 12/16/2014 4:18 PM

## 2014-12-17 LAB — LIPID PANEL

## 2014-12-17 LAB — TSH: TSH: 2.058 u[IU]/mL (ref 0.350–4.500)

## 2014-12-17 NOTE — Progress Notes (Signed)
Sent message to Gurney MaxinMike Mani, PA to make sure that I can schedule this appointment for him.

## 2014-12-24 ENCOUNTER — Ambulatory Visit (INDEPENDENT_AMBULATORY_CARE_PROVIDER_SITE_OTHER): Payer: 59 | Admitting: Urgent Care

## 2014-12-24 ENCOUNTER — Encounter: Payer: Self-pay | Admitting: Urgent Care

## 2014-12-24 VITALS — BP 110/90 | HR 95 | Temp 98.5°F | Resp 16 | Ht 70.5 in | Wt 228.6 lb

## 2014-12-24 DIAGNOSIS — R809 Proteinuria, unspecified: Secondary | ICD-10-CM

## 2014-12-24 DIAGNOSIS — I10 Essential (primary) hypertension: Secondary | ICD-10-CM

## 2014-12-24 NOTE — Patient Instructions (Signed)
Please request that notes from your appointment with the Nephrologist be sent to me. We will plan to see you here again in 3 months unless something else comes up at your appointment with Nephrology.    DASH Eating Plan DASH stands for "Dietary Approaches to Stop Hypertension." The DASH eating plan is a healthy eating plan that has been shown to reduce high blood pressure (hypertension). Additional health benefits may include reducing the risk of type 2 diabetes mellitus, heart disease, and stroke. The DASH eating plan may also help with weight loss. WHAT DO I NEED TO KNOW ABOUT THE DASH EATING PLAN? For the DASH eating plan, you will follow these general guidelines:  Choose foods with a percent daily value for sodium of less than 5% (as listed on the food label).  Use salt-free seasonings or herbs instead of table salt or sea salt.  Check with your health care provider or pharmacist before using salt substitutes.  Eat lower-sodium products, often labeled as "lower sodium" or "no salt added."  Eat fresh foods.  Eat more vegetables, fruits, and low-fat dairy products.  Choose whole grains. Look for the word "whole" as the first word in the ingredient list.  Choose fish and skinless chicken or [place] more often than red meat. Limit fish, poultry, and meat to 6 oz (170 g) each day.  Limit sweets, desserts, sugars, and sugary drinks.  Choose heart-healthy fats.  Limit cheese to 1 oz (28 g) per day.  Eat more home-cooked food and less restaurant, buffet, and fast food.  Limit fried foods.  Cook foods using methods other than frying.  Limit canned vegetables. If you do use them, rinse them well to decrease the sodium.  When eating at a restaurant, ask that your food be prepared with less salt, or no salt if possible. WHAT FOODS CAN I EAT? Seek help from a dietitian for individual calorie needs. Grains Whole grain or whole wheat bread. Brown rice. Whole grain or whole wheat pasta.  Quinoa, bulgur, and whole grain cereals. Low-sodium cereals. Corn or whole wheat flour tortillas. Whole grain cornbread. Whole grain crackers. Low-sodium crackers. Vegetables Fresh or frozen vegetables (raw, steamed, roasted, or grilled). Low-sodium or reduced-sodium tomato and vegetable juices. Low-sodium or reduced-sodium tomato sauce and paste. Low-sodium or reduced-sodium canned vegetables.  Fruits All fresh, canned (in natural juice), or frozen fruits. Meat and Other Protein Products Ground beef (85% or leaner), grass-fed beef, or beef trimmed of fat. Skinless chicken or [place]. Ground chicken or [place]. Pork trimmed of fat. All fish and seafood. Eggs. Dried beans, peas, or lentils. Unsalted nuts and seeds. Unsalted canned beans. Dairy Low-fat dairy products, such as skim or 1% milk, 2% or reduced-fat cheeses, low-fat ricotta or cottage cheese, or plain low-fat yogurt. Low-sodium or reduced-sodium cheeses. Fats and Oils Tub margarines without trans fats. Light or reduced-fat mayonnaise and salad dressings (reduced sodium). Avocado. Safflower, olive, or canola oils. Natural peanut or almond butter. Other Unsalted popcorn and pretzels. The items listed above may not be a complete list of recommended foods or beverages. Contact your dietitian for more options. WHAT FOODS ARE NOT RECOMMENDED? Grains White bread. White pasta. White rice. Refined cornbread. Bagels and croissants. Crackers that contain trans fat. Vegetables Creamed or fried vegetables. Vegetables in a cheese sauce. Regular canned vegetables. Regular canned tomato sauce and paste. Regular tomato and vegetable juices. Fruits Dried fruits. Canned fruit in light or heavy syrup. Fruit juice. Meat and Other Protein Products Fatty cuts of meat. Ribs,  chicken wings, bacon, sausage, bologna, salami, chitterlings, fatback, hot dogs, bratwurst, and packaged luncheon meats. Salted nuts and seeds. Canned beans with salt. Dairy Whole or 2%  milk, cream, half-and-half, and cream cheese. Whole-fat or sweetened yogurt. Full-fat cheeses or blue cheese. Nondairy creamers and whipped toppings. Processed cheese, cheese spreads, or cheese curds. Condiments Onion and garlic salt, seasoned salt, table salt, and sea salt. Canned and packaged gravies. Worcestershire sauce. Tartar sauce. Barbecue sauce. Teriyaki sauce. Soy sauce, including reduced sodium. Steak sauce. Fish sauce. Oyster sauce. Cocktail sauce. Horseradish. Ketchup and mustard. Meat flavorings and tenderizers. Bouillon cubes. Hot sauce. Tabasco sauce. Marinades. Taco seasonings. Relishes. Fats and Oils Butter, stick margarine, lard, shortening, ghee, and bacon fat. Coconut, palm kernel, or palm oils. Regular salad dressings. Other Pickles and olives. Salted popcorn and pretzels. The items listed above may not be a complete list of foods and beverages to avoid. Contact your dietitian for more information. WHERE CAN I FIND MORE INFORMATION? National Heart, Lung, and Blood Institute: [employer]stabl[employer].com Document Released: 11/12/2011 Document Revised: 04/09/2014 Document Reviewed: 09/27/2013 [facility] Patient Information 2015 [place], [place]. This information is not intended to replace advice given to you by your health care provider. Make sure you discuss any questions you have with your health care provider.

## 2014-12-24 NOTE — Progress Notes (Signed)
    MRN: [id number] DOB: [birth year]  Subjective:   [name] is a 39 y.o. male presenting for 1 week follow up on newly diagnosed hypertension, proteinuria. Patient is being managed with lis-HCT (20mg -12.5mg ), has been doing well with this medication. Reports some fatigue, dizziness for the first couple of days he started medication but not since then. He has started making significant dietary changes, will actively try to eat healthier and avoid salt. He is scheduled to see Nephrology this Thursday, 12/27/2014, [facility]. Denies chest pain, chest tightness, shob, heart racing, palpitations, urinary changes. Denies smoking, occasional alcohol use. Denies any other aggravating or relieving factors, no other questions or concerns.  [name] has a current medication list which includes the following prescription(s): lisinopril-hydrochlorothiazide.  He has No Known Allergies.  [name]  has no past medical history on file. Also  has no past surgical history on file.  ROS As in subjective.  Objective:   Vitals: BP 110/90 mmHg  Pulse 95  Temp(Src) 98.5 F (36.9 C) (Oral)  Resp 16  Ht 5' 10.5" (1.791 m)  Wt 228 lb 9.6 oz (103.692 kg)  BMI 32.33 kg/m2  SpO2 98%  Physical Exam  Constitutional: He is oriented to person, place, and time and well-developed, well-nourished, and in no distress.  Cardiovascular: Normal rate, regular rhythm, normal heart sounds and intact distal pulses.  Exam reveals no gallop and no friction rub.   No murmur heard. No carotid bruits.  Pulmonary/Chest: Effort normal and breath sounds normal. No respiratory distress. He has no wheezes. He has no rales. He exhibits no tenderness.  Abdominal: Soft. Bowel sounds are normal. He exhibits no distension and no mass. There is no tenderness.  No abdominal bruits.  Neurological: He is alert and oriented to person, place, and  time.  Skin: Skin is warm and dry. No rash noted. No erythema.  Psychiatric: Mood and affect normal.   Assessment and Plan :   1. Essential hypertension 2. Proteinuria - Stable, continue management with lis-HCT, healthy diet - Labs reviewed with patient, concerning elevated creatinine but GFR is stable, continue f/u with nephrologist - Follow up in 3 months or sooner if necessary   Wallis BambergMario Teria Khachatryan, PA-C [facility] [phone] 12/24/2014 10:56 AM

## 2014-12-24 NOTE — Progress Notes (Signed)
Lab was completed and abstracted into [facility]. [facility] not [facility] but I will send the results that were faxed to me to be scanned.  Wallis BambergMario Ephraim Reichel, PA-C [facility] [phone] 12/24/2014  4:10 PM

## 2015-01-01 ENCOUNTER — Encounter: Payer: Self-pay | Admitting: Urgent Care

## 2015-01-12 ENCOUNTER — Other Ambulatory Visit: Payer: Self-pay | Admitting: Urgent Care

## 2015-03-04 ENCOUNTER — Other Ambulatory Visit: Payer: Self-pay

## 2015-03-04 MED ORDER — LISINOPRIL-HYDROCHLOROTHIAZIDE 20-12.5 MG PO TABS: 1.0000 | ORAL_TABLET | Freq: Every day | ORAL | Status: DC

## 2015-03-07 ENCOUNTER — Other Ambulatory Visit: Payer: Self-pay

## 2015-04-17 ENCOUNTER — Ambulatory Visit (INDEPENDENT_AMBULATORY_CARE_PROVIDER_SITE_OTHER): Payer: 59 | Admitting: Urgent Care

## 2015-04-17 ENCOUNTER — Encounter: Payer: Self-pay | Admitting: Urgent Care

## 2015-04-17 VITALS — BP 127/75 | HR 77 | Temp 98.7°F | Resp 16 | Ht 70.25 in | Wt 224.2 lb

## 2015-04-17 DIAGNOSIS — I10 Essential (primary) hypertension: Secondary | ICD-10-CM

## 2015-04-17 LAB — POCT URINALYSIS DIPSTICK
Bilirubin, UA: NEGATIVE
GLUCOSE UA: NEGATIVE
Ketones, UA: NEGATIVE
Leukocytes, UA: NEGATIVE
NITRITE UA: NEGATIVE
Protein, UA: NEGATIVE
Spec Grav, UA: 1.01
UROBILINOGEN UA: 0.2
pH, UA: 5.5

## 2015-04-17 NOTE — Progress Notes (Signed)
    MRN: [id number] DOB: Sep 08, 1976  Subjective:   [name] is a 39 y.o. male presenting for follow up on HTN. Currently managed with lis-HCT. Reports compliance, denies headache, double vision, dizziness, heart racing, chest pain, palpitations, n/v, abdominal pain, hematuria, oliguria, anuria, lower leg swelling. Reports that he is doing very well. He has made significant dietary changes, avoids salt, eats healthy. Patient completed visit with nephrology, states that they did not find protein in his urine. They also did not make any significant recommendations per patient, stated that they would follow up with him in 6 months. Does not need refills today. Denies any other aggravating or relieving factors, no other questions or concerns.  [name] has a current medication list which includes the following prescription(s): lisinopril-hydrochlorothiazide. He has No Known Allergies.  [name]  has no past medical history on file. Also  has no past surgical history on file.  ROS As in subjective.  Objective:   Vitals: BP 127/75 mmHg  Pulse 77  Temp(Src) 98.7 F (37.1 C) (Oral)  Resp 16  Ht 5' 10.25" (1.784 m)  Wt 224 lb 3.2 oz (101.696 kg)  BMI 31.95 kg/m2  SpO2 100%  Physical Exam  Constitutional: He is oriented to person, place, and time. He appears well-developed and well-nourished.  Eyes: Conjunctivae and EOM are normal. Pupils are equal, round, and reactive to light. No scleral icterus.  Cardiovascular: Normal rate, regular rhythm and intact distal pulses.  Exam reveals no gallop and no friction rub.   No murmur heard. Pulmonary/Chest: No respiratory distress. He has no wheezes. He has no rales. He exhibits no tenderness.  Abdominal: Soft. Bowel sounds are normal. He exhibits no distension and no mass. There is no tenderness.  Musculoskeletal: He exhibits no edema or tenderness.  Neurological: He is alert and oriented to person, place, and time.  Skin: Skin is warm and dry. No rash  noted. No erythema. No pallor.   No results found for this or any previous visit (from the past 24 hour(s)).  Assessment and Plan :   1. Essential hypertension - Well-controlled, continue lis-HCT, diet and exercise. - Follow up in 6 months, refill meds in a few months.  Ethan BambergMario Radiah Lubinski, PA-C [facility] [phone] 04/17/2015 1:55 PM

## 2015-04-17 NOTE — Patient Instructions (Signed)
Please call our clinic in September/October and schedule an appointment for follow up on Hypertension. The appointment should be in November 2016 at the appointment Center.   Hypertension Hypertension, commonly called high blood pressure, is when the force of blood pumping through your arteries is too strong. Your arteries are the blood vessels that carry blood from your heart throughout your body. A blood pressure reading consists of a higher number over a lower number, such as 110/72. The higher number (systolic) is the pressure inside your arteries when your heart pumps. The lower number (diastolic) is the pressure inside your arteries when your heart relaxes. Ideally you want your blood pressure below 120/80. Hypertension forces your heart to work harder to pump blood. Your arteries may become narrow or stiff. Having hypertension puts you at risk for heart disease, stroke, and other problems.  RISK FACTORS Some risk factors for high blood pressure are controllable. Others are not.  Risk factors you cannot control include:   Race. You may be at higher risk if you are African American.  Age. Risk increases with age.  Gender. Men are at higher risk than women before age 37 years. After age 37, women are at higher risk than men. Risk factors you can control include:  Not getting enough exercise or physical activity.  Being overweight.  Getting too much fat, sugar, calories, or salt in your diet.  Drinking too much alcohol. SIGNS AND SYMPTOMS Hypertension does not usually cause signs or symptoms. Extremely high blood pressure (hypertensive crisis) may cause headache, anxiety, shortness of breath, and nosebleed. DIAGNOSIS  To check if you have hypertension, your health care provider will measure your blood pressure while you are seated, with your arm held at the level of your heart. It should be measured at least twice using the same arm. Certain conditions can cause a difference in blood  pressure between your right and left arms. A blood pressure reading that is higher than normal on one occasion does not mean that you need treatment. If one blood pressure reading is high, ask your health care provider about having it checked again. TREATMENT  Treating high blood pressure includes making lifestyle changes and possibly taking medicine. Living a healthy lifestyle can help lower high blood pressure. You may need to change some of your habits. Lifestyle changes may include:  Following the DASH diet. This diet is high in fruits, vegetables, and whole grains. It is low in salt, red meat, and added sugars.  Getting at least 2 hours of brisk physical activity every week.  Losing weight if necessary.  Not smoking.  Limiting alcoholic beverages.  Learning ways to reduce stress. If lifestyle changes are not enough to get your blood pressure under control, your health care provider may prescribe medicine. You may need to take more than one. Work closely with your health care provider to understand the risks and benefits. HOME CARE INSTRUCTIONS  Have your blood pressure rechecked as directed by your health care provider.   Take medicines only as directed by your health care provider. Follow the directions carefully. Blood pressure medicines must be taken as prescribed. The medicine does not work as well when you skip doses. Skipping doses also puts you at risk for problems.   Do not smoke.   Monitor your blood pressure at home as directed by your health care provider. SEEK MEDICAL CARE IF:   You think you are having a reaction to medicines taken.  You have recurrent headaches or feel  dizzy.  You have swelling in your ankles.  You have trouble with your vision. SEEK IMMEDIATE MEDICAL CARE IF:  You develop a severe headache or confusion.  You have unusual weakness, numbness, or feel faint.  You have severe chest or abdominal pain.  You vomit repeatedly.  You have  trouble breathing. MAKE SURE YOU:   Understand these instructions.  Will watch your condition.  Will get help right away if you are not doing well or get worse. Document Released: 11/23/2005 Document Revised: 04/09/2014 Document Reviewed: 09/15/2013 [facility] Patient Information 2015 [employer] [place]. This information is not intended to replace advice given to you by your health care provider. Make sure you discuss any questions you have with your health care provider.

## 2015-04-17 NOTE — Progress Notes (Signed)
Couldn't leave message voice mail box not set up

## 2015-04-18 ENCOUNTER — Telehealth: Payer: Self-pay

## 2015-04-18 LAB — COMPREHENSIVE METABOLIC PANEL
ALT: 54 U/L — AB (ref 0–53)
AST: 22 U/L (ref 0–37)
Albumin: 4.7 g/dL (ref 3.5–5.2)
Alkaline Phosphatase: 77 U/L (ref 39–117)
BUN: 15 mg/dL (ref 6–23)
CHLORIDE: 103 meq/L (ref 96–112)
CO2: 26 meq/L (ref 19–32)
Calcium: 10.4 mg/dL (ref 8.4–10.5)
Creat: 1.37 mg/dL — ABNORMAL HIGH (ref 0.50–1.35)
Glucose, Bld: 88 mg/dL (ref 70–99)
POTASSIUM: 4.5 meq/L (ref 3.5–5.3)
SODIUM: 139 meq/L (ref 135–145)
TOTAL PROTEIN: 7.5 g/dL (ref 6.0–8.3)
Total Bilirubin: 0.4 mg/dL (ref 0.2–1.2)

## 2015-04-18 NOTE — Telephone Encounter (Signed)
No message that I can see. Mani?

## 2015-04-18 NOTE — Progress Notes (Signed)
No answer/voicemailbox not set up

## 2015-04-18 NOTE — Telephone Encounter (Signed)
PATIENT STATES HE SAW MARIO MANI ON WED. SOMEONE TRIED TO CALL HIM TODAY, HOWEVER, HIS VOICE MAIL IS NOT SET UP YET SO HE IS NOT SURE WHO IT WAS? BEST PHONE [phone]  MBC

## 2015-04-19 NOTE — Telephone Encounter (Signed)
I did not call him. I did just send a request at ~13:25 today, 04/19/2015, to call patient though with lab results.  Thank you!

## 2015-04-24 NOTE — Progress Notes (Signed)
Spoke with patient i tried to set an appt up but your schedule isn't set up yet patient will call back at a later time

## 2015-05-10 ENCOUNTER — Other Ambulatory Visit: Payer: Self-pay | Admitting: Urgent Care

## 2015-08-01 ENCOUNTER — Encounter: Payer: Self-pay | Admitting: Urgent Care

## 2015-08-01 DIAGNOSIS — R809 Proteinuria, unspecified: Secondary | ICD-10-CM | POA: Insufficient documentation

## 2015-12-12 ENCOUNTER — Encounter: Payer: Self-pay | Admitting: Urgent Care

## 2015-12-12 ENCOUNTER — Ambulatory Visit (INDEPENDENT_AMBULATORY_CARE_PROVIDER_SITE_OTHER): Payer: 59 | Admitting: Urgent Care

## 2015-12-12 VITALS — BP 124/76 | HR 102 | Temp 98.8°F | Resp 16 | Ht 70.0 in | Wt 221.8 lb

## 2015-12-12 DIAGNOSIS — I10 Essential (primary) hypertension: Secondary | ICD-10-CM | POA: Diagnosis not present

## 2015-12-12 LAB — POCT URINALYSIS DIP (MANUAL ENTRY)
Bilirubin, UA: NEGATIVE
GLUCOSE UA: NEGATIVE
Ketones, POC UA: NEGATIVE
Leukocytes, UA: NEGATIVE
Nitrite, UA: NEGATIVE
PH UA: 5.5
Protein Ur, POC: NEGATIVE
SPEC GRAV UA: 1.02
UROBILINOGEN UA: 0.2

## 2015-12-12 MED ORDER — LISINOPRIL-HYDROCHLOROTHIAZIDE 20-12.5 MG PO TABS
1.0000 | ORAL_TABLET | Freq: Every day | ORAL | Status: DC
Start: 2015-12-12 — End: 2016-07-26

## 2015-12-12 NOTE — Patient Instructions (Signed)
Hypertension Hypertension, commonly called high blood pressure, is when the force of blood pumping through your arteries is too strong. Your arteries are the blood vessels that carry blood from your heart throughout your body. A blood pressure reading consists of a higher number over a lower number, such as 110/72. The higher number (systolic) is the pressure inside your arteries when your heart pumps. The lower number (diastolic) is the pressure inside your arteries when your heart relaxes. Ideally you want your blood pressure below 120/80. Hypertension forces your heart to work harder to pump blood. Your arteries may become narrow or stiff. Having untreated or uncontrolled hypertension can cause heart attack, stroke, kidney disease, and other problems. RISK FACTORS Some risk factors for high blood pressure are controllable. Others are not.  Risk factors you cannot control include:   Race. You may be at higher risk if you are African American.  Age. Risk increases with age.  Gender. Men are at higher risk than women before age 45 years. After age 65, women are at higher risk than men. Risk factors you can control include:  Not getting enough exercise or physical activity.  Being overweight.  Getting too much fat, sugar, calories, or salt in your diet.  Drinking too much alcohol. SIGNS AND SYMPTOMS Hypertension does not usually cause signs or symptoms. Extremely high blood pressure (hypertensive crisis) may cause headache, anxiety, shortness of breath, and nosebleed. DIAGNOSIS To check if you have hypertension, your health care provider will measure your blood pressure while you are seated, with your arm held at the level of your heart. It should be measured at least twice using the same arm. Certain conditions can cause a difference in blood pressure between your right and left arms. A blood pressure reading that is higher than normal on one occasion does not mean that you need treatment. If  it is not clear whether you have high blood pressure, you may be asked to return on a different day to have your blood pressure checked again. Or, you may be asked to monitor your blood pressure at home for 1 or more weeks. TREATMENT Treating high blood pressure includes making lifestyle changes and possibly taking medicine. Living a healthy lifestyle can help lower high blood pressure. You may need to change some of your habits. Lifestyle changes may include:  Following the DASH diet. This diet is high in fruits, vegetables, and whole grains. It is low in salt, red meat, and added sugars.  Keep your sodium intake below 2,300 mg per day.  Getting at least 30-45 minutes of aerobic exercise at least 4 times per week.  Losing weight if necessary.  Not smoking.  Limiting alcoholic beverages.  Learning ways to reduce stress. Your health care provider may prescribe medicine if lifestyle changes are not enough to get your blood pressure under control, and if one of the following is true:  You are 18-59 years of age and your systolic blood pressure is above 140.  You are 60 years of age or older, and your systolic blood pressure is above 150.  Your diastolic blood pressure is above 90.  You have diabetes, and your systolic blood pressure is over 140 or your diastolic blood pressure is over 90.  You have kidney disease and your blood pressure is above 140/90.  You have heart disease and your blood pressure is above 140/90. Your personal target blood pressure may vary depending on your medical conditions, your age, and other factors. HOME CARE INSTRUCTIONS    Have your blood pressure rechecked as directed by your health care provider.   Take medicines only as directed by your health care provider. Follow the directions carefully. Blood pressure medicines must be taken as prescribed. The medicine does not work as well when you skip doses. Skipping doses also puts you at risk for  problems.  Do not smoke.   Monitor your blood pressure at home as directed by your health care provider. SEEK MEDICAL CARE IF:   You think you are having a reaction to medicines taken.  You have recurrent headaches or feel dizzy.  You have swelling in your ankles.  You have trouble with your vision. SEEK IMMEDIATE MEDICAL CARE IF:  You develop a severe headache or confusion.  You have unusual weakness, numbness, or feel faint.  You have severe chest or abdominal pain.  You vomit repeatedly.  You have trouble breathing. MAKE SURE YOU:   Understand these instructions.  Will watch your condition.  Will get help right away if you are not doing well or get worse.   This information is not intended to replace advice given to you by your health care provider. Make sure you discuss any questions you have with your health care provider.   Document Released: 11/23/2005 Document Revised: 04/09/2015 Document Reviewed: 09/15/2013 Elsevier Interactive Patient Education 2016 [employer]vier Inc.  

## 2015-12-12 NOTE — Progress Notes (Signed)
    MRN: [id number] DOB: 06-11-1976  Subjective:   [name] is a 40 y.o. male presenting for chief complaint of Follow-up; Hypertension; and [facility]  Here for follow up on his HTN. Patient was seen by Dr. Marisue HumbleSanford in 06/2015, nephrologist, cleared for 1 year follow up. Denies lightheadedness, dizziness, chronic headache, double vision, chest pain, shortness of breath, heart racing, palpitations, nausea, vomiting, abdominal pain, hematuria, lower leg swelling. Denies smoking cigarettes. Drinks 4-5 shots of liquor per week.  [name] has a current medication list which includes the following prescription(s): lisinopril-hydrochlorothiazide. Also has No Known Allergies.  [name]  has no past medical history on file. Also  has no past surgical history on file.  Objective:   Vitals: BP 124/76 mmHg  Pulse 102  Temp(Src) 98.8 F (37.1 C) (Oral)  Resp 16  Ht 5\' 10"  (1.778 m)  Wt 221 lb 12.8 oz (100.608 kg)  BMI 31.83 kg/m2  SpO2 99%  BP Readings from Last 3 Encounters:  12/12/15 124/76  04/17/15 127/75  12/24/14 110/90   Wt Readings from Last 3 Encounters:  12/12/15 221 lb 12.8 oz (100.608 kg)  04/17/15 224 lb 3.2 oz (101.696 kg)  12/24/14 228 lb 9.6 oz (103.692 kg)   Physical Exam  Constitutional: He is oriented to person, place, and time. He appears well-developed and well-nourished.  HENT:  Mouth/Throat: Oropharynx is clear and moist.  Eyes: Pupils are equal, round, and reactive to light. Right eye exhibits no discharge. Left eye exhibits no discharge. No scleral icterus.  Cardiovascular: Normal rate, regular rhythm and intact distal pulses.  Exam reveals no gallop and no friction rub.   No murmur heard. Pulmonary/Chest: No respiratory distress. He has no wheezes. He has no rales.  Abdominal: Soft. Bowel sounds are normal. He exhibits no distension and no mass. There is no tenderness.  Musculoskeletal: He exhibits no edema.  Neurological: He  is alert and oriented to person, place, and time.  Skin: Skin is warm and dry. No rash noted. No erythema. No pallor.   Assessment and Plan :   1. Essential hypertension - Controlled, continue dietary modifications. Refilled lis-HCT. Follow up in 6 months.  Wallis BambergMario Ignace Mandigo, PA-C [facility] [phone] 12/12/2015 4:18 PM

## 2015-12-13 LAB — COMPLETE METABOLIC PANEL WITH GFR
ALT: 64 U/L — ABNORMAL HIGH (ref 9–46)
AST: 22 U/L (ref 10–40)
Albumin: 5.1 g/dL (ref 3.6–5.1)
Alkaline Phosphatase: 79 U/L (ref 40–115)
BILIRUBIN TOTAL: 0.5 mg/dL (ref 0.2–1.2)
BUN: 19 mg/dL (ref 7–25)
CHLORIDE: 101 mmol/L (ref 98–110)
CO2: 25 mmol/L (ref 20–31)
Calcium: 10.5 mg/dL — ABNORMAL HIGH (ref 8.6–10.3)
Creat: 1.47 mg/dL — ABNORMAL HIGH (ref 0.60–1.35)
GFR, EST AFRICAN AMERICAN: 69 mL/min (ref >=60)
GFR, EST NON AFRICAN AMERICAN: 60 mL/min (ref >=60)
Glucose, Bld: 94 mg/dL (ref 65–99)
Potassium: 4 mmol/L (ref 3.5–5.3)
Sodium: 138 mmol/L (ref 135–146)
Total Protein: 7.9 g/dL (ref 6.1–8.1)

## 2015-12-18 ENCOUNTER — Encounter: Payer: Self-pay | Admitting: Urgent Care

## 2016-01-23 ENCOUNTER — Ambulatory Visit: Payer: 59 | Admitting: Urgent Care

## 2016-06-11 ENCOUNTER — Ambulatory Visit: Payer: 59 | Admitting: Urgent Care

## 2016-07-26 ENCOUNTER — Other Ambulatory Visit: Payer: Self-pay | Admitting: Urgent Care

## 2016-07-26 DIAGNOSIS — I10 Essential (primary) hypertension: Secondary | ICD-10-CM

## 2016-11-28 ENCOUNTER — Other Ambulatory Visit: Payer: Self-pay | Admitting: Physician Assistant

## 2016-11-28 DIAGNOSIS — I10 Essential (primary) hypertension: Secondary | ICD-10-CM

## 2017-02-14 ENCOUNTER — Other Ambulatory Visit: Payer: Self-pay | Admitting: Urgent Care

## 2017-02-14 DIAGNOSIS — I10 Essential (primary) hypertension: Secondary | ICD-10-CM

## 2017-03-22 ENCOUNTER — Other Ambulatory Visit: Payer: Self-pay | Admitting: Urgent Care

## 2017-03-22 DIAGNOSIS — I10 Essential (primary) hypertension: Secondary | ICD-10-CM

## 2017-03-25 ENCOUNTER — Encounter: Payer: Self-pay | Admitting: Urgent Care

## 2017-03-25 ENCOUNTER — Ambulatory Visit (INDEPENDENT_AMBULATORY_CARE_PROVIDER_SITE_OTHER): Payer: 59 | Admitting: Urgent Care

## 2017-03-25 VITALS — BP 155/95 | HR 71 | Temp 98.6°F | Resp 16 | Ht 70.0 in | Wt 199.0 lb

## 2017-03-25 DIAGNOSIS — I10 Essential (primary) hypertension: Secondary | ICD-10-CM | POA: Diagnosis not present

## 2017-03-25 DIAGNOSIS — R030 Elevated blood-pressure reading, without diagnosis of hypertension: Secondary | ICD-10-CM | POA: Diagnosis not present

## 2017-03-25 MED ORDER — LISINOPRIL-HYDROCHLOROTHIAZIDE 20-12.5 MG PO TABS: 1.0000 | ORAL_TABLET | Freq: Every day | ORAL | 1 refills | Status: DC

## 2017-03-25 NOTE — Progress Notes (Signed)
   MRN: [id number] DOB: 1976/02/25  Subjective:   Mico Spark is a 41 y.o. male presenting for follow up on Hypertension.   Currently managed with lis-HCTZ. Patient has been out of his medications for 1 week. Avoids salt in diet, is exercising. Reports that he gets lightheaded at work here and there. This generally happens when he is squatting and then stands very quickly as he is working. Has a new job, feels a lot less stressed from this. His parents had a lot of serious health issues in 2017 but is managing this well. Father had renal cell carcinoma, mother had a severe stroke. They are recovering and doing very well. Denies chronic headache, blurred vision, chest pain, shortness of breath, heart racing, palpitations, nausea, vomiting, abdominal pain, hematuria, lower leg swelling. Denies smoking cigarettes. Has occasional shot of whiskey.   Kuba has a current medication list which includes the following prescription(s): lisinopril-hydrochlorothiazide. Also has No Known Allergies.  Pearse  has no past medical history on file. Also  has no past surgical history on file.  Objective:   Vitals: BP (!) 155/95   Pulse 71   Temp 98.6 F (37 C) (Oral)   Resp 16   Ht  (1.778 m)   Wt 199 lb (90.3 kg)   SpO2 100%   BMI 28.55 kg/m   Wt Readings from Last 3 Encounters:  03/25/17 199 lb (90.3 kg)  12/12/15 221 lb 12.8 oz (100.6 kg)  04/17/15 224 lb 3.2 oz (101.7 kg)    BP Readings from Last 3 Encounters:  03/25/17 (!) 155/95  12/12/15 124/76  04/17/15 127/75   Physical Exam  Constitutional: He is oriented to person, place, and time. He appears well-developed and well-nourished.  HENT:  Mouth/Throat: Oropharynx is clear and moist.  Eyes: Pupils are equal, round, and reactive to light.  Neck: Normal range of motion. Neck supple. No thyromegaly present.  Cardiovascular: Normal rate, regular rhythm and intact distal pulses.  Exam reveals no gallop and no friction rub.   No murmur  heard. Pulmonary/Chest: No respiratory distress. He has no wheezes. He has no rales.  Abdominal: Soft. Bowel sounds are normal. He exhibits no distension and no mass. There is no tenderness. There is no guarding.  Musculoskeletal: He exhibits no edema.  Neurological: He is alert and oriented to person, place, and time.  Skin: Skin is warm and dry.  Psychiatric: He has a normal mood and affect.    Assessment and Plan :   1. Essential hypertension 2. Elevated blood pressure reading - Restart BP medications. Check BP at home. If it remains elevated, will increase to  HCTZ with lisinopril at . Patient verbalized understanding.  Wallis Bamberg, PA-C [facility] [phone] 03/25/2017  8:20 AM

## 2017-03-25 NOTE — Patient Instructions (Addendum)
Check your blood pressure once weekly in the morning after sitting for at least 5 minutes. If your blood pressure is consistently above 140 for the top number or 90 for the bottom number, please let me know so that we can make dose adjustments.     Hypertension Hypertension, commonly called high blood pressure, is when the force of blood pumping through the arteries is too strong. The arteries are the blood vessels that carry blood from the heart throughout the body. Hypertension forces the heart to work harder to pump blood and may cause arteries to become narrow or stiff. Having untreated or uncontrolled hypertension can cause heart attacks, strokes, kidney disease, and other problems. A blood pressure reading consists of a higher number over a lower number. Ideally, your blood pressure should be below 120/80. The first ("top") number is called the systolic pressure. It is a measure of the pressure in your arteries as your heart beats. The second ("bottom") number is called the diastolic pressure. It is a measure of the pressure in your arteries as the heart relaxes. What are the causes? The cause of this condition is not known. What increases the risk? Some risk factors for high blood pressure are under your control. Others are not. Factors you can change   Smoking.  Having type 2 diabetes mellitus, high cholesterol, or both.  Not getting enough exercise or physical activity.  Being overweight.  Having too much fat, sugar, calories, or salt (sodium) in your diet.  Drinking too much alcohol. Factors that are difficult or impossible to change   Having chronic kidney disease.  Having a family history of high blood pressure.  Age. Risk increases with age.  Race. You may be at higher risk if you are African-American.  Gender. Men are at higher risk than women before age 1. After age 11, women are at higher risk than men.  Having obstructive sleep apnea.  Stress. What are the  signs or symptoms? Extremely high blood pressure (hypertensive crisis) may cause:  Headache.  Anxiety.  Shortness of breath.  Nosebleed.  Nausea and vomiting.  Severe chest pain.  Jerky movements you cannot control (seizures). How is this diagnosed? This condition is diagnosed by measuring your blood pressure while you are seated, with your arm resting on a surface. The cuff of the blood pressure monitor will be placed directly against the skin of your upper arm at the level of your heart. It should be measured at least twice using the same arm. Certain conditions can cause a difference in blood pressure between your right and left arms. Certain factors can cause blood pressure readings to be lower or higher than normal (elevated) for a short period of time:  When your blood pressure is higher when you are in a health care provider's office than when you are at home, this is called white coat hypertension. Most people with this condition do not need medicines.  When your blood pressure is higher at home than when you are in a health care provider's office, this is called masked hypertension. Most people with this condition may need medicines to control blood pressure. If you have a high blood pressure reading during one visit or you have normal blood pressure with other risk factors:  You may be asked to return on a different day to have your blood pressure checked again.  You may be asked to monitor your blood pressure at home for 1 week or longer. If you are diagnosed with  hypertension, you may have other blood or imaging tests to help your health care provider understand your overall risk for other conditions. How is this treated? This condition is treated by making healthy lifestyle changes, such as eating healthy foods, exercising more, and reducing your alcohol intake. Your health care provider may prescribe medicine if lifestyle changes are not enough to get your blood pressure  under control, and if:  Your systolic blood pressure is above 130.  Your diastolic blood pressure is above 80. Your personal target blood pressure may vary depending on your medical conditions, your age, and other factors. Follow these instructions at home: Eating and drinking   Eat a diet that is high in fiber and potassium, and low in sodium, added sugar, and fat. An example eating plan is called the DASH (Dietary Approaches to Stop Hypertension) diet. To eat this way:  Eat plenty of fresh fruits and vegetables. Try to fill half of your plate at each meal with fruits and vegetables.  Eat whole grains, such as whole wheat pasta, brown rice, or whole grain bread. Fill about one quarter of your plate with whole grains.  Eat or drink low-fat dairy products, such as skim milk or low-fat yogurt.  Avoid fatty cuts of meat, processed or cured meats, and poultry with skin. Fill about one quarter of your plate with lean proteins, such as fish, chicken without skin, beans, eggs, and tofu.  Avoid premade and processed foods. These tend to be higher in sodium, added sugar, and fat.  Reduce your daily sodium intake. Most people with hypertension should eat less than 1,500 mg of sodium a day.  Limit alcohol intake to no more than 1 drink a day for nonpregnant women and 2 drinks a day for men. One drink equals 12 oz of beer, 5 oz of wine, or 1 oz of hard liquor. Lifestyle   Work with your health care provider to maintain a healthy body weight or to lose weight. Ask what an ideal weight is for you.  Get at least 30 minutes of exercise that causes your heart to beat faster (aerobic exercise) most days of the week. Activities may include walking, swimming, or biking.  Include exercise to strengthen your muscles (resistance exercise), such as pilates or lifting weights, as part of your weekly exercise routine. Try to do these types of exercises for 30 minutes at least 3 days a week.  Do not use any  products that contain nicotine or tobacco, such as cigarettes and e-cigarettes. If you need help quitting, ask your health care provider.  Monitor your blood pressure at home as told by your health care provider.  Keep all follow-up visits as told by your health care provider. This is important. Medicines   Take over-the-counter and prescription medicines only as told by your health care provider. Follow directions carefully. Blood pressure medicines must be taken as prescribed.  Do not skip doses of blood pressure medicine. Doing this puts you at risk for problems and can make the medicine less effective.  Ask your health care provider about side effects or reactions to medicines that you should watch for. Contact a health care provider if:  You think you are having a reaction to a medicine you are taking.  You have headaches that keep coming back (recurring).  You feel dizzy.  You have swelling in your ankles.  You have trouble with your vision. Get help right away if:  You develop a severe headache or confusion.  You have unusual weakness or numbness.  You feel faint.  You have severe pain in your chest or abdomen.  You vomit repeatedly.  You have trouble breathing. Summary  Hypertension is when the force of blood pumping through your arteries is too strong. If this condition is not controlled, it may put you at risk for serious complications.  Your personal target blood pressure may vary depending on your medical conditions, your age, and other factors. For most people, a normal blood pressure is less than 120/80.  Hypertension is treated with lifestyle changes, medicines, or a combination of both. Lifestyle changes include weight loss, eating a healthy, low-sodium diet, exercising more, and limiting alcohol. This information is not intended to replace advice given to you by your health care provider. Make sure you discuss any questions you have with your health care  provider. Document Released: 11/23/2005 Document Revised: 10/21/2016 Document Reviewed: 10/21/2016 Elsevier Interactive Patient Education  2017 [employer].     IF you received an x-ray today, you will receive an invoice from [facility]. Please contact [facility] at [phone] with questions or concerns regarding your invoice.   IF you received labwork today, you will receive an invoice from [facility]. Please contact [facility] at [phone] with questions or concerns regarding your invoice.   Our billing staff will not be able to assist you with questions regarding bills from these companies.  You will be contacted with the lab results as soon as they are available. The fastest way to get your results is to activate your My Chart account. Instructions are located on the last page of this paperwork. If you have not heard from [place] regarding the results in 2 weeks, please contact this office.

## 2017-03-26 LAB — COMPREHENSIVE METABOLIC PANEL
A/G RATIO: 2.1 (ref 1.2–2.2)
ALBUMIN: 4.8 g/dL (ref 3.5–5.5)
ALT: 35 IU/L (ref 0–44)
AST: 23 IU/L (ref 0–40)
Alkaline Phosphatase: 89 IU/L (ref 39–117)
BILIRUBIN TOTAL: 0.2 mg/dL (ref 0.0–1.2)
BUN / CREAT RATIO: 8 — AB (ref 9–20)
BUN: 11 mg/dL (ref 6–20)
CALCIUM: 9.2 mg/dL (ref 8.7–10.2)
CHLORIDE: 100 mmol/L (ref 96–106)
CO2: 21 mmol/L (ref 18–29)
Creatinine, Ser: 1.4 mg/dL — ABNORMAL HIGH (ref 0.76–1.27)
GFR, EST AFRICAN AMERICAN: 73 mL/min/{1.73_m2} (ref >59)
GFR, EST NON AFRICAN AMERICAN: 63 mL/min/{1.73_m2} (ref >59)
GLUCOSE: 88 mg/dL (ref 65–99)
Globulin, Total: 2.3 g/dL (ref 1.5–4.5)
Potassium: 3.9 mmol/L (ref 3.5–5.2)
Sodium: 141 mmol/L (ref 134–144)
TOTAL PROTEIN: 7.1 g/dL (ref 6.0–8.5)

## 2017-03-26 LAB — MICROALBUMIN / CREATININE URINE RATIO
Creatinine, Urine: 78.2 mg/dL
Microalbumin, Urine: <3 ug/mL

## 2017-09-30 ENCOUNTER — Ambulatory Visit: Payer: 59 | Admitting: Urgent Care

## 2018-01-04 ENCOUNTER — Other Ambulatory Visit: Payer: Self-pay | Admitting: Urgent Care

## 2018-01-04 DIAGNOSIS — I10 Essential (primary) hypertension: Secondary | ICD-10-CM

## 2018-03-08 ENCOUNTER — Encounter: Payer: Self-pay | Admitting: Urgent Care

## 2018-03-08 ENCOUNTER — Ambulatory Visit (INDEPENDENT_AMBULATORY_CARE_PROVIDER_SITE_OTHER): Payer: 59 | Admitting: Urgent Care

## 2018-03-08 VITALS — BP 111/74 | HR 81 | Temp 98.3°F | Resp 18 | Ht 70.0 in | Wt 199.6 lb

## 2018-03-08 DIAGNOSIS — Z114 Encounter for screening for human immunodeficiency virus [HIV]: Secondary | ICD-10-CM | POA: Diagnosis not present

## 2018-03-08 DIAGNOSIS — Z23 Encounter for immunization: Secondary | ICD-10-CM

## 2018-03-08 DIAGNOSIS — Z130 Encounter for screening for diseases of the blood and blood-forming organs and certain disorders involving the immune mechanism: Secondary | ICD-10-CM

## 2018-03-08 DIAGNOSIS — R809 Proteinuria, unspecified: Secondary | ICD-10-CM | POA: Diagnosis not present

## 2018-03-08 DIAGNOSIS — Z1329 Encounter for screening for other suspected endocrine disorder: Secondary | ICD-10-CM

## 2018-03-08 DIAGNOSIS — Z0000 Encounter for general adult medical examination without abnormal findings: Secondary | ICD-10-CM | POA: Diagnosis not present

## 2018-03-08 DIAGNOSIS — Z13220 Encounter for screening for lipoid disorders: Secondary | ICD-10-CM | POA: Diagnosis not present

## 2018-03-08 DIAGNOSIS — Z13228 Encounter for screening for other metabolic disorders: Secondary | ICD-10-CM

## 2018-03-08 DIAGNOSIS — I10 Essential (primary) hypertension: Secondary | ICD-10-CM

## 2018-03-08 MED ORDER — LISINOPRIL-HYDROCHLOROTHIAZIDE 20-12.5 MG PO TABS: 1.0000 | ORAL_TABLET | Freq: Every day | ORAL | 3 refills | Status: DC

## 2018-03-08 MED ORDER — LISINOPRIL-HYDROCHLOROTHIAZIDE 20-12.5 MG PO TABS: 1.0000 | ORAL_TABLET | Freq: Every day | ORAL | 1 refills | Status: DC

## 2018-03-08 NOTE — Progress Notes (Signed)
MRN: [id number]  Subjective:   [name] is a 42 y.o. male presenting for annual physical exam. Patient is unmarried, does not have kids. Has a great dane, walks every day with his dog. Has 1-2 drinks of alcohol per week. Smokes ~5 cigars per day.   Medical care team includes: PCP: Wallis BambergMani, Saleah Rishel, PA-C Vision: No visual deficits.  Dental: Dental cleanings once every 6 months. Specialists: None.   Health Maintenance: Needs tdap updated.   [name] has a current medication list which includes the following prescription(s): lisinopril-hydrochlorothiazide. He has No Known Allergies. [name]  has a past medical history of Hypertension. Denies past surgical history. His family history includes Hypertension in his father and mother; Stroke in his maternal grandfather and paternal grandmother.  Review of Systems  Constitutional: Negative for chills, diaphoresis, fever, malaise/fatigue and weight loss.  HENT: Negative for congestion, ear discharge, ear pain, hearing loss, nosebleeds, sore throat and tinnitus.   Eyes: Negative for blurred vision, double vision, photophobia, pain, discharge and redness.  Respiratory: Negative for cough, shortness of breath and wheezing.   Cardiovascular: Negative for chest pain, palpitations and leg swelling.  Gastrointestinal: Negative for abdominal pain, blood in stool, constipation, diarrhea, nausea and vomiting.  Genitourinary: Negative for dysuria, flank pain, frequency, hematuria and urgency.  Musculoskeletal: Negative for back pain, joint pain and myalgias.  Skin: Negative for itching and rash.  Neurological: Negative for dizziness, tingling, seizures, loss of consciousness, weakness and headaches.  Endo/Heme/Allergies: Negative for polydipsia.  Psychiatric/Behavioral: Negative for depression, hallucinations, memory loss, substance abuse and suicidal ideas. The patient is not nervous/anxious and does not have insomnia.    Objective:   Vitals: BP 111/74    Pulse 81   Temp 98.3 F (36.8 C) (Oral)   Resp 18   Ht 5\' 10"  (1.778 m)   Wt 199 lb 9.6 oz (90.5 kg)   SpO2 99%   BMI 28.64 kg/m    Visual Acuity Screening   Right eye Left eye Both eyes  Without correction: 20/20 20/20 20/15   With correction:       Wt Readings from Last 3 Encounters:  03/08/18 199 lb 9.6 oz (90.5 kg)  03/25/17 199 lb (90.3 kg)  12/12/15 221 lb 12.8 oz (100.6 kg)    Physical Exam  Constitutional: He is oriented to person, place, and time. He appears well-developed and well-nourished.  HENT:  TM's intact bilaterally, no effusions or erythema. Nasal turbinates pink and moist, nasal passages patent. No sinus tenderness. Oropharynx clear, mucous membranes moist, dentition in good repair.  Eyes: Pupils are equal, round, and reactive to light. Conjunctivae and EOM are normal. Right eye exhibits no discharge. Left eye exhibits no discharge. No scleral icterus.  Neck: Normal range of motion. Neck supple. No thyromegaly present.  Cardiovascular: Normal rate, regular rhythm and intact distal pulses. Exam reveals no gallop and no friction rub.  No murmur heard. Pulmonary/Chest: No stridor. No respiratory distress. He has no wheezes. He has no rales.  Abdominal: Soft. Bowel sounds are normal. He exhibits no distension and no mass. There is no tenderness.  Musculoskeletal: Normal range of motion. He exhibits no edema or tenderness.  Lymphadenopathy:    He has no cervical adenopathy.  Neurological: He is alert and oriented to person, place, and time. He has normal reflexes. He displays normal reflexes. Coordination normal.  Skin: Skin is warm and dry. No rash noted. No erythema. No pallor.  Psychiatric: He has a normal mood and affect.   Assessment and  Plan :   Annual physical exam  Essential hypertension - Plan: lisinopril-hydrochlorothiazide (PRINZIDE,ZESTORETIC) 20-12.5 MG tablet, Microalbumin / creatinine urine ratio, DISCONTINUED: lisinopril-hydrochlorothiazide  (PRINZIDE,ZESTORETIC) 20-12.5 MG tablet  Screening for HIV (human immunodeficiency virus) - Plan: HIV antibody  Screening for metabolic disorder - Plan: Comprehensive metabolic panel  Screening cholesterol level - Plan: Lipid panel  Screening for deficiency anemia - Plan: CBC  Screening for thyroid disorder - Plan: TSH  Need for Tdap vaccination  Proteinuria, unspecified type - Plan: Microalbumin / creatinine urine ratio  Labs pending, patient is doing very well. Discussed healthy lifestyle, diet, exercise, preventative care, vaccinations, and addressed patient's concerns. Refills provided for his lis-HCTZ. Follow up with results and consider starting statin. Return-to-clinic precautions discussed, patient verbalized understanding.   Wallis Bamberg, PA-C [facility] at [facility] [phone] 03/08/2018  10:32 AM

## 2018-03-08 NOTE — Patient Instructions (Addendum)
Health Maintenance, Male A healthy lifestyle and preventive care is important for your health and wellness. Ask your health care provider about what schedule of regular examinations is right for you. What should I know about weight and diet? Eat a Healthy Diet  Eat plenty of vegetables, fruits, whole grains, low-fat dairy products, and lean protein.  Do not eat a lot of foods high in solid fats, added sugars, or salt.  Maintain a Healthy Weight Regular exercise can help you achieve or maintain a healthy weight. You should:  Do at least 150 minutes of exercise each week. The exercise should increase your heart rate and make you sweat (moderate-intensity exercise).  Do strength-training exercises at least twice a week.  Watch Your Levels of Cholesterol and Blood Lipids  Have your blood tested for lipids and cholesterol every 5 years starting at 40 years of age. If you are at high risk for heart disease, you should start having your blood tested when you are 40 years old. You may need to have your cholesterol levels checked more often if: ? Your lipid or cholesterol levels are high. ? You are older than 40 years of age. ? You are at high risk for heart disease.  What should I know about cancer screening? Many types of cancers can be detected early and may often be prevented. Lung Cancer  You should be screened every year for lung cancer if: ? You are a current smoker who has smoked for at least 30 years. ? You are a former smoker who has quit within the past 15 years.  Talk to your health care provider about your screening options, when you should start screening, and how often you should be screened.  Colorectal Cancer  Routine colorectal cancer screening usually begins at 40 years of age and should be repeated every 5-10 years until you are 40 years old. You may need to be screened more often if early forms of precancerous polyps or small growths are found. Your health care provider  may recommend screening at an earlier age if you have risk factors for colon cancer.  Your health care provider may recommend using home test kits to check for hidden blood in the stool.  A small camera at the end of a tube can be used to examine your colon (sigmoidoscopy or colonoscopy). This checks for the earliest forms of colorectal cancer.  Prostate and Testicular Cancer  Depending on your age and overall health, your health care provider may do certain tests to screen for prostate and testicular cancer.  Talk to your health care provider about any symptoms or concerns you have about testicular or prostate cancer.  Skin Cancer  Check your skin from head to toe regularly.  Tell your health care provider about any new moles or changes in moles, especially if: ? There is a change in a mole's size, shape, or color. ? You have a mole that is larger than a pencil eraser.  Always use sunscreen. Apply sunscreen liberally and repeat throughout the day.  Protect yourself by wearing long sleeves, pants, a wide-brimmed hat, and sunglasses when outside.  What should I know about heart disease, diabetes, and high blood pressure?  If you are 18-39 years of age, have your blood pressure checked every 3-5 years. If you are 40 years of age or older, have your blood pressure checked every year. You should have your blood pressure measured twice-once when you are at a hospital or clinic, and once when   you are not at a hospital or clinic. Record the average of the two measurements. To check your blood pressure when you are not at a hospital or clinic, you can use: ? An automated blood pressure machine at a pharmacy. ? A home blood pressure monitor.  Talk to your health care provider about your target blood pressure.  If you are between 45-79 years old, ask your health care provider if you should take aspirin to prevent heart disease.  Have regular diabetes screenings by checking your fasting blood  sugar level. ? If you are at a normal weight and have a low risk for diabetes, have this test once every three years after the age of 45. ? If you are overweight and have a high risk for diabetes, consider being tested at a younger age or more often.  A one-time screening for abdominal aortic aneurysm (AAA) by ultrasound is recommended for men aged 65-75 years who are current or former smokers. What should I know about preventing infection? Hepatitis B If you have a higher risk for hepatitis B, you should be screened for this virus. Talk with your health care provider to find out if you are at risk for hepatitis B infection. Hepatitis C Blood testing is recommended for:  Everyone born from 1945 through 1965.  Anyone with known risk factors for hepatitis C.  Sexually Transmitted Diseases (STDs)  You should be screened each year for STDs including gonorrhea and chlamydia if: ? You are sexually active and are younger than 40 years of age. ? You are older than 40 years of age and your health care provider tells you that you are at risk for this type of infection. ? Your sexual activity has changed since you were last screened and you are at an increased risk for chlamydia or gonorrhea. Ask your health care provider if you are at risk.  Talk with your health care provider about whether you are at high risk of being infected with HIV. Your health care provider may recommend a prescription medicine to help prevent HIV infection.  What else can I do?  Schedule regular health, dental, and eye exams.  Stay current with your vaccines (immunizations).  Do not use any tobacco products, such as cigarettes, chewing tobacco, and e-cigarettes. If you need help quitting, ask your health care provider.  Limit alcohol intake to no more than 2 drinks per day. One drink equals 12 ounces of beer, 5 ounces of wine, or 1 ounces of hard liquor.  Do not use street drugs.  Do not share needles.  Ask your  health care provider for help if you need support or information about quitting drugs.  Tell your health care provider if you often feel depressed.  Tell your health care provider if you have ever been abused or do not feel safe at home. This information is not intended to replace advice given to you by your health care provider. Make sure you discuss any questions you have with your health care provider. Document Released: 05/21/2008 Document Revised: 07/22/2016 Document Reviewed: 08/27/2015 Elsevier Interactive Patient Education  2018 Elsevier Inc.     Hypertension Hypertension, commonly called high blood pressure, is when the force of blood pumping through the arteries is too strong. The arteries are the blood vessels that carry blood from the heart throughout the body. Hypertension forces the heart to work harder to pump blood and may cause arteries to become narrow or stiff. Having untreated or uncontrolled hypertension can cause   heart attacks, strokes, kidney disease, and other problems. A blood pressure reading consists of a higher number over a lower number. Ideally, your blood pressure should be below 120/80. The first ("top") number is called the systolic pressure. It is a measure of the pressure in your arteries as your heart beats. The second ("bottom") number is called the diastolic pressure. It is a measure of the pressure in your arteries as the heart relaxes. What are the causes? The cause of this condition is not known. What increases the risk? Some risk factors for high blood pressure are under your control. Others are not. Factors you can change  Smoking.  Having type 2 diabetes mellitus, high cholesterol, or both.  Not getting enough exercise or physical activity.  Being overweight.  Having too much fat, sugar, calories, or salt (sodium) in your diet.  Drinking too much alcohol. Factors that are difficult or impossible to change  Having chronic kidney  disease.  Having a family history of high blood pressure.  Age. Risk increases with age.  Race. You may be at higher risk if you are African-American.  Gender. Men are at higher risk than women before age 45. After age 65, women are at higher risk than men.  Having obstructive sleep apnea.  Stress. What are the signs or symptoms? Extremely high blood pressure (hypertensive crisis) may cause:  Headache.  Anxiety.  Shortness of breath.  Nosebleed.  Nausea and vomiting.  Severe chest pain.  Jerky movements you cannot control (seizures).  How is this diagnosed? This condition is diagnosed by measuring your blood pressure while you are seated, with your arm resting on a surface. The cuff of the blood pressure monitor will be placed directly against the skin of your upper arm at the level of your heart. It should be measured at least twice using the same arm. Certain conditions can cause a difference in blood pressure between your right and left arms. Certain factors can cause blood pressure readings to be lower or higher than normal (elevated) for a short period of time:  When your blood pressure is higher when you are in a health care provider's office than when you are at home, this is called white coat hypertension. Most people with this condition do not need medicines.  When your blood pressure is higher at home than when you are in a health care provider's office, this is called masked hypertension. Most people with this condition may need medicines to control blood pressure.  If you have a high blood pressure reading during one visit or you have normal blood pressure with other risk factors:  You may be asked to return on a different day to have your blood pressure checked again.  You may be asked to monitor your blood pressure at home for 1 week or longer.  If you are diagnosed with hypertension, you may have other blood or imaging tests to help your health care provider  understand your overall risk for other conditions. How is this treated? This condition is treated by making healthy lifestyle changes, such as eating healthy foods, exercising more, and reducing your alcohol intake. Your health care provider may prescribe medicine if lifestyle changes are not enough to get your blood pressure under control, and if:  Your systolic blood pressure is above 130.  Your diastolic blood pressure is above 80.  Your personal target blood pressure may vary depending on your medical conditions, your age, and other factors. Follow these instructions at home:   Eating and drinking  Eat a diet that is high in fiber and potassium, and low in sodium, added sugar, and fat. An example eating plan is called the DASH (Dietary Approaches to Stop Hypertension) diet. To eat this way: ? Eat plenty of fresh fruits and vegetables. Try to fill half of your plate at each meal with fruits and vegetables. ? Eat whole grains, such as whole wheat pasta, brown rice, or whole grain bread. Fill about one quarter of your plate with whole grains. ? Eat or drink low-fat dairy products, such as skim milk or low-fat yogurt. ? Avoid fatty cuts of meat, processed or cured meats, and poultry with skin. Fill about one quarter of your plate with lean proteins, such as fish, chicken without skin, beans, eggs, and tofu. ? Avoid premade and processed foods. These tend to be higher in sodium, added sugar, and fat.  Reduce your daily sodium intake. Most people with hypertension should eat less than 1,500 mg of sodium a day.  Limit alcohol intake to no more than 1 drink a day for nonpregnant women and 2 drinks a day for men. One drink equals 12 oz of beer, 5 oz of wine, or 1 oz of hard liquor. Lifestyle  Work with your health care provider to maintain a healthy body weight or to lose weight. Ask what an ideal weight is for you.  Get at least 30 minutes of exercise that causes your heart to beat faster  (aerobic exercise) most days of the week. Activities may include walking, swimming, or biking.  Include exercise to strengthen your muscles (resistance exercise), such as pilates or lifting weights, as part of your weekly exercise routine. Try to do these types of exercises for 30 minutes at least 3 days a week.  Do not use any products that contain nicotine or tobacco, such as cigarettes and e-cigarettes. If you need help quitting, ask your health care provider.  Monitor your blood pressure at home as told by your health care provider.  Keep all follow-up visits as told by your health care provider. This is important. Medicines  Take over-the-counter and prescription medicines only as told by your health care provider. Follow directions carefully. Blood pressure medicines must be taken as prescribed.  Do not skip doses of blood pressure medicine. Doing this puts you at risk for problems and can make the medicine less effective.  Ask your health care provider about side effects or reactions to medicines that you should watch for. Contact a health care provider if:  You think you are having a reaction to a medicine you are taking.  You have headaches that keep coming back (recurring).  You feel dizzy.  You have swelling in your ankles.  You have trouble with your vision. Get help right away if:  You develop a severe headache or confusion.  You have unusual weakness or numbness.  You feel faint.  You have severe pain in your chest or abdomen.  You vomit repeatedly.  You have trouble breathing. Summary  Hypertension is when the force of blood pumping through your arteries is too strong. If this condition is not controlled, it may put you at risk for serious complications.  Your personal target blood pressure may vary depending on your medical conditions, your age, and other factors. For most people, a normal blood pressure is less than 120/80.  Hypertension is treated with  lifestyle changes, medicines, or a combination of both. Lifestyle changes include weight loss, eating a healthy,   low-sodium diet, exercising more, and limiting alcohol. This information is not intended to replace advice given to you by your health care provider. Make sure you discuss any questions you have with your health care provider. Document Released: 11/23/2005 Document Revised: 10/21/2016 Document Reviewed: 10/21/2016 Elsevier Interactive Patient Education  2018 Elsevier Inc.     IF you received an x-ray today, you will receive an invoice from [facility]. Please contact [facility] at [phone] with questions or concerns regarding your invoice.   IF you received labwork today, you will receive an invoice from [facility]. Please contact [facility] at [phone] with questions or concerns regarding your invoice.   Our billing staff will not be able to assist you with questions regarding bills from these companies.  You will be contacted with the lab results as soon as they are available. The fastest way to get your results is to activate your My Chart account. Instructions are located on the last page of this paperwork. If you have not heard from us regarding the results in 2 weeks, please contact this office.      

## 2018-03-09 LAB — COMPREHENSIVE METABOLIC PANEL
ALBUMIN: 5 g/dL (ref 3.5–5.5)
ALT: 27 IU/L (ref 0–44)
AST: 19 IU/L (ref 0–40)
Albumin/Globulin Ratio: 2.1 (ref 1.2–2.2)
Alkaline Phosphatase: 88 IU/L (ref 39–117)
BUN / CREAT RATIO: 11 (ref 9–20)
BUN: 16 mg/dL (ref 6–24)
Bilirubin Total: 0.5 mg/dL (ref 0.0–1.2)
CALCIUM: 9.5 mg/dL (ref 8.7–10.2)
CO2: 22 mmol/L (ref 20–29)
CREATININE: 1.51 mg/dL — AB (ref 0.76–1.27)
Chloride: 99 mmol/L (ref 96–106)
GFR, EST AFRICAN AMERICAN: 66 mL/min/{1.73_m2} (ref >59)
GFR, EST NON AFRICAN AMERICAN: 57 mL/min/{1.73_m2} — AB (ref >59)
GLOBULIN, TOTAL: 2.4 g/dL (ref 1.5–4.5)
Glucose: 94 mg/dL (ref 65–99)
Potassium: 4 mmol/L (ref 3.5–5.2)
SODIUM: 141 mmol/L (ref 134–144)
Total Protein: 7.4 g/dL (ref 6.0–8.5)

## 2018-03-09 LAB — MICROALBUMIN / CREATININE URINE RATIO
Creatinine, Urine: 42.9 mg/dL
Microalb/Creat Ratio: <7 mg/g creat (ref 0.0–30.0)
Microalbumin, Urine: <3 ug/mL

## 2018-03-09 LAB — CBC
HEMOGLOBIN: 15.9 g/dL (ref 13.0–17.7)
Hematocrit: 47.9 % (ref 37.5–51.0)
MCH: 27.7 pg (ref 26.6–33.0)
MCHC: 33.2 g/dL (ref 31.5–35.7)
MCV: 84 fL (ref 79–97)
Platelets: 155 10*3/uL (ref 150–379)
RBC: 5.73 x10E6/uL (ref 4.14–5.80)
RDW: 14.5 % (ref 12.3–15.4)
WBC: 5.2 10*3/uL (ref 3.4–10.8)

## 2018-03-09 LAB — LIPID PANEL
Chol/HDL Ratio: 2.8 ratio (ref 0.0–5.0)
Cholesterol, Total: 148 mg/dL (ref 100–199)
HDL: 53 mg/dL (ref >39)
LDL CALC: 86 mg/dL (ref 0–99)
TRIGLYCERIDES: 43 mg/dL (ref 0–149)
VLDL Cholesterol Cal: 9 mg/dL (ref 5–40)

## 2018-03-09 LAB — TSH: TSH: 1.71 u[IU]/mL (ref 0.450–4.500)

## 2018-03-09 LAB — HIV ANTIBODY (ROUTINE TESTING W REFLEX): HIV Screen 4th Generation wRfx: NONREACTIVE

## 2018-09-10 ENCOUNTER — Other Ambulatory Visit: Payer: Self-pay | Admitting: Urgent Care

## 2018-09-10 DIAGNOSIS — I10 Essential (primary) hypertension: Secondary | ICD-10-CM

## 2018-09-19 ENCOUNTER — Other Ambulatory Visit: Payer: Self-pay | Admitting: Urgent Care

## 2018-09-19 DIAGNOSIS — I10 Essential (primary) hypertension: Secondary | ICD-10-CM

## 2018-10-06 ENCOUNTER — Other Ambulatory Visit: Payer: Self-pay | Admitting: Urgent Care

## 2018-10-06 DIAGNOSIS — I10 Essential (primary) hypertension: Secondary | ICD-10-CM

## 2018-10-10 MED ORDER — LISINOPRIL-HYDROCHLOROTHIAZIDE 20-12.5 MG PO TABS: 1.0000 | ORAL_TABLET | Freq: Every day | ORAL | 0 refills | Status: DC

## 2018-11-22 ENCOUNTER — Other Ambulatory Visit: Payer: Self-pay | Admitting: Urgent Care

## 2018-11-22 DIAGNOSIS — I10 Essential (primary) hypertension: Secondary | ICD-10-CM

## 2018-11-23 NOTE — Telephone Encounter (Signed)
Requested medication (s) are due for refill today: Yes  Requested medication (s) are on the active medication list: Yes  Last refill:  10/10/18  Future visit scheduled: No  Notes to clinic:  Left pt. A message he is over due an appointment. Did not refill.    Requested Prescriptions  Pending Prescriptions Disp Refills   lisinopril-hydrochlorothiazide (PRINZIDE,ZESTORETIC) 20-12.5 MG tablet [Pharmacy Med Name: LISINOPRIL-HCTZ 20-12.5 MG TAB] 90 tablet 1    Sig: TAKE 1 TABLET BY MOUTH DAILY. OFFICE VISIT NEEDED     Cardiovascular:  ACEI + Diuretic Combos Failed - 11/22/2018  1:36 PM      Failed - Na in normal range and within 180 days    Sodium  Date Value Ref Range Status  03/08/2018 141 134 - 144 mmol/L Final         Failed - K in normal range and within 180 days    Potassium  Date Value Ref Range Status  03/08/2018 4.0 3.5 - 5.2 mmol/L Final         Failed - Cr in normal range and within 180 days    Creat  Date Value Ref Range Status  12/12/2015 1.47 (H) 0.60 - 1.35 mg/dL Final   Creatinine, Ser  Date Value Ref Range Status  03/08/2018 1.51 (H) 0.76 - 1.27 mg/dL Final         Failed - Ca in normal range and within 180 days    Calcium  Date Value Ref Range Status  03/08/2018 9.5 8.7 - 10.2 mg/dL Final         Failed - Valid encounter within last 6 months    Recent Outpatient Visits          8 months ago Annual physical exam   [facility]   1 year ago Essential hypertension   [facility]   2 years ago Essential hypertension   [facility] [place]   3 years ago Essential hypertension   [facility], [place]   3 years ago Essential hypertension   [facility]             Passed - Patient is not pregnant      Passed - Last BP in normal range    BP Readings from Last 1 Encounters:  03/08/18 111/74

## 2018-12-07 ENCOUNTER — Other Ambulatory Visit: Payer: Self-pay | Admitting: Urgent Care

## 2018-12-07 DIAGNOSIS — I10 Essential (primary) hypertension: Secondary | ICD-10-CM

## 2018-12-31 ENCOUNTER — Other Ambulatory Visit: Payer: Self-pay | Admitting: Urgent Care

## 2018-12-31 DIAGNOSIS — I10 Essential (primary) hypertension: Secondary | ICD-10-CM

## 2019-04-14 ENCOUNTER — Other Ambulatory Visit: Payer: Self-pay | Admitting: Family Medicine

## 2019-04-14 DIAGNOSIS — I10 Essential (primary) hypertension: Secondary | ICD-10-CM

## 2019-04-25 ENCOUNTER — Other Ambulatory Visit: Payer: Self-pay | Admitting: Family Medicine

## 2019-04-25 DIAGNOSIS — I10 Essential (primary) hypertension: Secondary | ICD-10-CM

## 2019-04-25 NOTE — Telephone Encounter (Signed)
Pt over due for OV. Called pt and made appt for June (Dr Creta Levin next available appt)- Informed pt appt will most likely be a virtual appt. Please change to Doxy appt.)  Routing rx request back to office and to review and refill until upcoming visit.

## 2019-04-25 NOTE — Telephone Encounter (Signed)
Requested medication (s) are due for refill today: yes  Requested medication (s) are on the active medication list: yes  Last refill:  01/02/19  Future visit scheduled: yes  Notes to clinic:  Called pt and OV in June. Pt needs appt changed to doxy appt. Pt aware appt will most likely be virtual.   Requested Prescriptions  Pending Prescriptions Disp Refills   lisinopril-hydrochlorothiazide (ZESTORETIC) 20-12.5 MG tablet [Pharmacy Med Name: LISINOPRIL-HCTZ 20-12.5 MG TAB] 30 tablet 0    Sig: TAKE 1 TABLET BY MOUTH DAILY. OFFICE VISIT NEEDED     Cardiovascular:  ACEI + Diuretic Combos Failed - 04/25/2019 10:40 AM      Failed - Na in normal range and within 180 days    Sodium  Date Value Ref Range Status  03/08/2018 141 134 - 144 mmol/L Final         Failed - K in normal range and within 180 days    Potassium  Date Value Ref Range Status  03/08/2018 4.0 3.5 - 5.2 mmol/L Final         Failed - Cr in normal range and within 180 days    Creat  Date Value Ref Range Status  12/12/2015 1.47 (H) 0.60 - 1.35 mg/dL Final   Creatinine, Ser  Date Value Ref Range Status  03/08/2018 1.51 (H) 0.76 - 1.27 mg/dL Final         Failed - Ca in normal range and within 180 days    Calcium  Date Value Ref Range Status  03/08/2018 9.5 8.7 - 10.2 mg/dL Final         Failed - Valid encounter within last 6 months    Recent Outpatient Visits          1 year ago Annual physical exam   Primary Care at [facility], [place], [place]   2 years ago Essential hypertension   Primary Care at [facility], [place], [place]   3 years ago Essential hypertension   Primary Care at [facility], [place], [place]   4 years ago Essential hypertension   Primary Care at [facility], [place], [place]   4 years ago Essential hypertension   Primary Care at [facility], [place], [place]      Future Appointments            In 2 weeks Doristine Bosworth, MD Primary Care at [place]           Passed - Patient is not pregnant     Passed - Last BP in normal range    BP Readings from Last 1 Encounters:  03/08/18 111/74

## 2019-05-15 ENCOUNTER — Ambulatory Visit: Payer: Self-pay | Admitting: Family Medicine

## 2019-05-19 ENCOUNTER — Other Ambulatory Visit: Payer: Self-pay | Admitting: Family Medicine

## 2019-05-19 DIAGNOSIS — I10 Essential (primary) hypertension: Secondary | ICD-10-CM

## 2019-05-31 ENCOUNTER — Other Ambulatory Visit: Payer: Self-pay | Admitting: Family Medicine

## 2019-05-31 DIAGNOSIS — I10 Essential (primary) hypertension: Secondary | ICD-10-CM

## 2019-06-07 ENCOUNTER — Ambulatory Visit: Payer: Self-pay | Admitting: Family Medicine

## 2019-06-08 ENCOUNTER — Ambulatory Visit (INDEPENDENT_AMBULATORY_CARE_PROVIDER_SITE_OTHER): Payer: Self-pay | Admitting: Family Medicine

## 2019-06-08 ENCOUNTER — Other Ambulatory Visit: Payer: Self-pay | Admitting: *Deleted

## 2019-06-08 ENCOUNTER — Other Ambulatory Visit: Payer: Self-pay

## 2019-06-08 DIAGNOSIS — I10 Essential (primary) hypertension: Secondary | ICD-10-CM

## 2019-06-08 DIAGNOSIS — N289 Disorder of kidney and ureter, unspecified: Secondary | ICD-10-CM

## 2019-06-08 MED ORDER — LISINOPRIL-HYDROCHLOROTHIAZIDE 20-12.5 MG PO TABS: ORAL_TABLET | ORAL | 1 refills | Status: DC

## 2019-06-08 NOTE — Progress Notes (Signed)
Telemedicine Encounter- SOAP NOTE Established Patient  I discussed the limitations, risks, security and privacy concerns of performing an evaluation and management service by telephone and the availability of in person appointments. I also discussed with the patient that there may be a patient responsible charge related to this service. The patient expressed understanding and agreed to proceed.  This telephone encounter was conducted with the patient's verbal consent via audio telecommunications: yes Patient was instructed to have this encounter in a suitably private space; and to only have persons present to whom they give permission to participate. In addition, patient identity was confirmed by use of name plus two identifiers (DOB and address).  I spent a total of 10min talking with the patient.   [name] is a 43 y.o. male established patient. Telephone visit today for refill on blood pressure medication  Pt needs refill on blood pressure medication. Pt states no concerns-out of meds for 3 days and noticed elevated blood pressure-130's /80's-normally on medication 110's/70's. Pt states no visual change, headaches or other concerns  Patient Active Problem List   Diagnosis Date Noted  . Proteinuria 08/01/2015    Past Medical History:  Diagnosis Date  . Hypertension     Current Outpatient Medications  Medication Sig Dispense Refill  . lisinopril-hydrochlorothiazide (ZESTORETIC) 20-12.5 MG tablet TAKE 1 TABLET BY MOUTH DAILY. OFFICE VISIT NEEDED 30 tablet 0   No current facility-administered medications for this visit.     No Known Allergies  Social History   Socioeconomic History  . Marital status: Single    Spouse name: Not on file  . Number of children: Not on file  . Years of education: Not on file  . Highest education level: Not on file  Occupational History  . Not on file  Social Needs  . Financial resource strain: Not on file  . Food insecurity   Worry: Not on file    Inability: Not on file  . Transportation needs    Medical: Not on file    Non-medical: Not on file  Tobacco Use  . Smoking status: Light Tobacco Smoker    Types: Cigars  . Smokeless tobacco: Never Used  Substance and Sexual Activity  . Alcohol use: Yes    Alcohol/week: 0.0 standard drinks  . Drug use: Not on file  . Sexual activity: Not on file  Lifestyle  . Physical activity    Days per week: Not on file    Minutes per session: Not on file  . Stress: Not on file  Relationships  . Social Musicianconnections    Talks on phone: Not on file    Gets together: Not on file    Attends religious service: Not on file    Active member of club or organization: Not on file    Attends meetings of clubs or organizations: Not on file    Relationship status: Not on file  . Intimate partner violence    Fear of current or ex partner: Not on file    Emotionally abused: Not on file    Physically abused: Not on file    Forced sexual activity: Not on file  Other Topics Concern  . Not on file  Social History Narrative  . Not on file    Review of Systems  Eyes: Negative for blurred vision.  Cardiovascular: Negative for chest pain and palpitations.  Neurological: Negative for dizziness and headaches.    Objective   Vitals as reported by the patient: Reported 130/80/'s  today(out of meds x 3 days) 1. Essential hypertension Lisinopril/HCTZ-rx #[id number]/f-reviewed labwork  2. Renal function impairment Seen by nephro in the past-stable I discussed the assessment and treatment plan with the patient. The patient was provided an opportunity to ask questions and all were answered. The patient agreed with the plan and demonstrated an understanding of the instructions.   The patient was advised to call back or seek an in-person evaluation if the symptoms worsen or if the condition fails to improve as anticipated.  I provided10 minutes of non-face-to-face time during this encounter.   Aayra Hornbaker Hannah Beat, MD  Primary Care at [place] 06-08-19

## 2020-01-10 ENCOUNTER — Encounter: Payer: Self-pay | Admitting: Registered Nurse

## 2020-01-10 ENCOUNTER — Other Ambulatory Visit: Payer: Self-pay | Admitting: General Practice

## 2020-01-10 ENCOUNTER — Other Ambulatory Visit: Payer: Self-pay

## 2020-01-10 ENCOUNTER — Telehealth (INDEPENDENT_AMBULATORY_CARE_PROVIDER_SITE_OTHER): Payer: Self-pay | Admitting: Registered Nurse

## 2020-01-10 DIAGNOSIS — I10 Essential (primary) hypertension: Secondary | ICD-10-CM

## 2020-01-10 MED ORDER — LISINOPRIL-HYDROCHLOROTHIAZIDE 20-12.5 MG PO TABS: ORAL_TABLET | ORAL | 1 refills | Status: DC

## 2020-01-10 NOTE — Telephone Encounter (Signed)
Copied from CRM [id number]. Topic: Quick Communication - Rx Refill/Question >> Jan 10, 2020 12:21 PM Marylen Ponto wrote: Medication: lisinopril-hydrochlorothiazide (ZESTORETIC) 20-12.5 MG tablet  Has the patient contacted their pharmacy? yes  Preferred Pharmacy (with phone number or street name): [id number] - [place], [place] - [address] AT [facility] Phone: [phone]  Fax: [phone]  Agent: Please be advised that RX refills may take up to 3 business days. We ask that you follow-up with your pharmacy.

## 2020-01-10 NOTE — Telephone Encounter (Signed)
Please schedule a office or tel-med visit for refills. Provider no longer here in office and no one else has been this patient

## 2020-01-10 NOTE — Telephone Encounter (Signed)
Called pt set up virtual with [name] for refills . Toc set up for 02/23/2020 with [name]

## 2020-01-10 NOTE — Progress Notes (Signed)
Patient states he is calling for a medication refill lisinopril. No other concerns.

## 2020-01-10 NOTE — Progress Notes (Signed)
Telemedicine Encounter- SOAP NOTE Established Patient  This telephone encounter was conducted with the patient's (or proxy's) verbal consent via audio telecommunications: yes   Patient was instructed to have this encounter in a suitably private space; and to only have persons present to whom they give permission to participate. In addition, patient identity was confirmed by use of name plus two identifiers (DOB and address).  I discussed the limitations, risks, security and privacy concerns of performing an evaluation and management service by telephone and the availability of in person appointments. I also discussed with the patient that there may be a patient responsible charge related to this service. The patient expressed understanding and agreed to proceed.  I spent a total of 11 minutes talking with the patient or their proxy.  No chief complaint on file.   Subjective   [name] is a 44 y.o. established patient. Telephone visit today for med refill  HPI HTN: has been on lisinopril-HCTZ 20-12.5mg  PO qd for some time - good effect. Takes BP at home, wnl today. Feels well overall and is without CV concern.  Notes that he does not have insurance at this time, as such declines labs and CPE  Brief history of abnormal renal function with proteinuria, however, proteinuria has resolved. Cr mildly elevated, GFR mildly low, but steady over preceding years. With good diet and well controlled HTN, we can delay labs for a short while.   Patient Active Problem List   Diagnosis Date Noted  . Essential hypertension 06/08/2019  . Renal function impairment 06/08/2019  . Proteinuria 08/01/2015    Past Medical History:  Diagnosis Date  . Hypertension     Current Outpatient Medications  Medication Sig Dispense Refill  . lisinopril-hydrochlorothiazide (ZESTORETIC) 20-12.5 MG tablet TAKE 1 TABLET BY MOUTH DAILY. OFFICE VISIT NEEDED 90 tablet 1   No current facility-administered  medications for this visit.    No Known Allergies  Social History   Socioeconomic History  . Marital status: Single    Spouse name: Not on file  . Number of children: 0  . Years of education: Not on file  . Highest education level: Not on file  Occupational History  . Not on file  Tobacco Use  . Smoking status: Light Tobacco Smoker    Types: Cigars  . Smokeless tobacco: Never Used  Substance and Sexual Activity  . Alcohol use: Yes    Alcohol/week: 0.0 standard drinks  . Drug use: Never  . Sexual activity: Not Currently  Other Topics Concern  . Not on file  Social History Narrative  . Not on file   Social Determinants of Health   Financial Resource Strain:   . Difficulty of Paying Living Expenses: Not on file  Food Insecurity:   . Worried About Programme researcher, broadcasting/film/video in the Last Year: Not on file  . Ran Out of Food in the Last Year: Not on file  Transportation Needs:   . Lack of Transportation (Medical): Not on file  . Lack of Transportation (Non-Medical): Not on file  Physical Activity:   . Days of Exercise per Week: Not on file  . Minutes of Exercise per Session: Not on file  Stress:   . Feeling of Stress : Not on file  Social Connections:   . Frequency of Communication with Friends and Family: Not on file  . Frequency of Social Gatherings with Friends and Family: Not on file  . Attends Religious Services: Not on file  . Active  Member of Clubs or Organizations: Not on file  . Attends Archivist Meetings: Not on file  . Marital Status: Not on file  Intimate Partner Violence:   . Fear of Current or Ex-Partner: Not on file  . Emotionally Abused: Not on file  . Physically Abused: Not on file  . Sexually Abused: Not on file    Review of Systems  Constitutional: Negative.   HENT: Negative.   Eyes: Negative.   Respiratory: Negative.   Cardiovascular: Negative.   Gastrointestinal: Negative.   Genitourinary: Negative.   Musculoskeletal: Negative.     Skin: Negative.   Neurological: Negative.   Endo/Heme/Allergies: Negative.   Psychiatric/Behavioral: Negative.   All other systems reviewed and are negative.   Objective   Vitals as reported by the patient: Today's Vitals   01/10/20 1529  BP: 125/74  Weight: 241 lb (109.3 kg)    Diagnoses and all orders for this visit:  Essential hypertension -     lisinopril-hydrochlorothiazide (ZESTORETIC) 20-12.5 MG tablet; TAKE 1 TABLET BY MOUTH DAILY. OFFICE VISIT NEEDED   PLAN  Refill meds x 6 mo  Return at that time for CPE and labs  Otherwise patient seems healthy at this time  Patient encouraged to call clinic with any questions, comments, or concerns.   I discussed the assessment and treatment plan with the patient. The patient was provided an opportunity to ask questions and all were answered. The patient agreed with the plan and demonstrated an understanding of the instructions.   The patient was advised to call back or seek an in-person evaluation if the symptoms worsen or if the condition fails to improve as anticipated.  I provided 11 minutes of non-face-to-face time during this encounter.  Maximiano Coss, NP  Primary Care at [facility]

## 2020-01-10 NOTE — Telephone Encounter (Signed)
Requested medication (s) are due for refill today:   Yes  Requested medication (s) are on the active medication list:   Yes  Future visit scheduled:   No     Last ordered: 06/08/2019  #90  1 refill   Failed protocol   Requested Prescriptions  Pending Prescriptions Disp Refills   lisinopril-hydrochlorothiazide (ZESTORETIC) 20-12.5 MG tablet 90 tablet 1    Sig: TAKE 1 TABLET BY MOUTH DAILY. OFFICE VISIT NEEDED      Cardiovascular:  ACEI + Diuretic Combos Failed - 01/10/2020 12:43 PM      Failed - Na in normal range and within 180 days    Sodium  Date Value Ref Range Status  03/08/2018 141 134 - 144 mmol/L Final          Failed - K in normal range and within 180 days    Potassium  Date Value Ref Range Status  03/08/2018 4.0 3.5 - 5.2 mmol/L Final          Failed - Cr in normal range and within 180 days    Creat  Date Value Ref Range Status  12/12/2015 1.47 (H) 0.60 - 1.35 mg/dL Final   Creatinine, Ser  Date Value Ref Range Status  03/08/2018 1.51 (H) 0.76 - 1.27 mg/dL Final          Failed - Ca in normal range and within 180 days    Calcium  Date Value Ref Range Status  03/08/2018 9.5 8.7 - 10.2 mg/dL Final   Calcium, Ion  Date Value Ref Range Status  05/29/2008 1.22  Final          Failed - Valid encounter within last 6 months    Recent Outpatient Visits           7 months ago Essential hypertension   Primary Care at [facility], Minerva Fester, MD   1 year ago Annual physical exam   Primary Care at [facility], [place], [place]   2 years ago Essential hypertension   Primary Care at [facility], [place], [place]   4 years ago Essential hypertension   Primary Care at [facility], [place], [place]   4 years ago Essential hypertension   Primary Care at [facility], [place], [place]              Passed - Patient is not pregnant      Passed - Last BP in normal range    BP Readings from Last 1 Encounters:  03/08/18 111/74

## 2020-02-23 ENCOUNTER — Encounter: Payer: Self-pay | Admitting: Registered Nurse

## 2020-02-26 ENCOUNTER — Encounter: Payer: Self-pay | Admitting: Registered Nurse

## 2020-07-06 ENCOUNTER — Other Ambulatory Visit: Payer: Self-pay | Admitting: Registered Nurse

## 2020-07-06 DIAGNOSIS — I10 Essential (primary) hypertension: Secondary | ICD-10-CM

## 2020-07-08 NOTE — Telephone Encounter (Signed)
No further refills without office visit 

## 2020-07-08 NOTE — Telephone Encounter (Signed)
Called pt. And scheduled for OV with NP Kateri Plummer

## 2020-07-08 NOTE — Telephone Encounter (Signed)
Schedule a 6 month f/u appt for HTN and med refills. 30 day supply has been sent

## 2020-07-31 ENCOUNTER — Ambulatory Visit: Payer: Self-pay | Admitting: Registered Nurse

## 2020-08-09 ENCOUNTER — Ambulatory Visit: Payer: Self-pay | Admitting: Registered Nurse

## 2020-09-01 ENCOUNTER — Other Ambulatory Visit: Payer: Self-pay | Admitting: Registered Nurse

## 2020-09-01 DIAGNOSIS — I10 Essential (primary) hypertension: Secondary | ICD-10-CM

## 2020-09-01 NOTE — Telephone Encounter (Signed)
Requested Prescriptions  Pending Prescriptions Disp Refills  . lisinopril-hydrochlorothiazide (ZESTORETIC) 20-12.5 MG tablet [Pharmacy Med Name: LISINOPRIL-HCTZ 20-12.5 MG TAB] 90 tablet 0    Sig: TAKE 1 TABLET BY MOUTH DAILY. OFFICE VISIT NEEDED     Cardiovascular:  ACEI + Diuretic Combos Failed - 09/01/2020  8:32 AM      Failed - Na in normal range and within 180 days    Sodium  Date Value Ref Range Status  03/08/2018 141 134 - 144 mmol/L Final         Failed - K in normal range and within 180 days    Potassium  Date Value Ref Range Status  03/08/2018 4.0 3.5 - 5.2 mmol/L Final         Failed - Cr in normal range and within 180 days    Creat  Date Value Ref Range Status  12/12/2015 1.47 (H) 0.60 - 1.35 mg/dL Final   Creatinine, Ser  Date Value Ref Range Status  03/08/2018 1.51 (H) 0.76 - 1.27 mg/dL Final         Failed - Ca in normal range and within 180 days    Calcium  Date Value Ref Range Status  03/08/2018 9.5 8.7 - 10.2 mg/dL Final   Calcium, Ion  Date Value Ref Range Status  05/29/2008 1.22  Final         Failed - Valid encounter within last 6 months    Recent Outpatient Visits          7 months ago Essential hypertension   [facility] at [facility], Gerlene Burdock, NP   1 year ago Essential hypertension   Primary Care at [facility], Minerva Fester, MD   2 years ago Annual physical exam   Primary Care at [facility], [place], [place]   3 years ago Essential hypertension   Primary Care at [facility], [place], [place]   4 years ago Essential hypertension   Primary Care at [facility], [place], [place]      Future Appointments            In 3 days Janeece Agee, NP Primary Care at [place]           Passed - Patient is not pregnant      Passed - Last BP in normal range    BP Readings from Last 1 Encounters:  01/10/20 125/74         '

## 2020-09-04 ENCOUNTER — Ambulatory Visit: Payer: Self-pay | Admitting: Registered Nurse

## 2020-09-05 ENCOUNTER — Encounter: Payer: Self-pay | Admitting: Registered Nurse

## 2020-12-01 ENCOUNTER — Other Ambulatory Visit: Payer: Self-pay | Admitting: Registered Nurse

## 2020-12-01 DIAGNOSIS — I10 Essential (primary) hypertension: Secondary | ICD-10-CM

## 2021-01-17 ENCOUNTER — Other Ambulatory Visit: Payer: Self-pay | Admitting: Registered Nurse

## 2021-01-17 DIAGNOSIS — I10 Essential (primary) hypertension: Secondary | ICD-10-CM

## 2021-01-17 NOTE — Telephone Encounter (Signed)
Pt needs to have F/U visit in order to get meds refilled

## 2021-01-17 NOTE — Telephone Encounter (Signed)
Left message on voicemail for patient to call back and schedule an in-office appt for med refills.

## 2021-01-17 NOTE — Telephone Encounter (Signed)
Requested medication (s) are due for refill today:  yes  Requested medication (s) are on the active medication list: yes  Last refill:  12/21/2020  Future visit scheduled: no  Notes to clinic:  overdue for appointment   Requested Prescriptions  Pending Prescriptions Disp Refills   lisinopril-hydrochlorothiazide (ZESTORETIC) 20-12.5 MG tablet [Pharmacy Med Name: LISINOPRIL-HCTZ 20-12.5 MG TAB] 30 tablet 2    Sig: TAKE 1 TABLET BY MOUTH DAILY. OFFICE VISIT NEEDED      Cardiovascular:  ACEI + Diuretic Combos Failed - 01/17/2021  8:33 AM      Failed - Na in normal range and within 180 days    Sodium  Date Value Ref Range Status  03/08/2018 141 134 - 144 mmol/L Final          Failed - K in normal range and within 180 days    Potassium  Date Value Ref Range Status  03/08/2018 4.0 3.5 - 5.2 mmol/L Final          Failed - Cr in normal range and within 180 days    Creat  Date Value Ref Range Status  12/12/2015 1.47 (H) 0.60 - 1.35 mg/dL Final   Creatinine, Ser  Date Value Ref Range Status  03/08/2018 1.51 (H) 0.76 - 1.27 mg/dL Final          Failed - Ca in normal range and within 180 days    Calcium  Date Value Ref Range Status  03/08/2018 9.5 8.7 - 10.2 mg/dL Final   Calcium, Ion  Date Value Ref Range Status  05/29/2008 1.22  Final          Failed - Valid encounter within last 6 months    Recent Outpatient Visits           1 year ago Essential hypertension   [facility] Care at [facility], Gerlene Burdock, NP   1 year ago Essential hypertension   Primary Care at [facility], Minerva Fester, MD   2 years ago Annual physical exam   Primary Care at [facility], [place], [place]   3 years ago Essential hypertension   Primary Care at [facility], [place], [place]   5 years ago Essential hypertension   Primary Care at [facility], [place], [place]                Passed - Patient is not pregnant      Passed - Last BP in normal range    BP Readings from Last 1 Encounters:  01/10/20  125/74
